# Patient Record
Sex: Female | Born: 1990 | Hispanic: Yes | Marital: Single | Smoking: Never smoker
Health system: Southern US, Community
[De-identification: ages and names within clinical notes are randomized; demographics above are authoritative.]

## PROBLEM LIST (undated history)

## (undated) DIAGNOSIS — Z789 Other specified health status: Secondary | ICD-10-CM

---

## 2016-08-03 NOTE — L&D Delivery Note (Signed)
Patient is a 26 y.o. now G1P1 s/p NSVD at 535w1d, who was admitted for PPROM at 35 weeks on 12/16.  She progressed with augmentation to complete and pushed 10minutes to deliver. NICU at bedside. Cord clamping delayed by one minute then clamped by CNM and cut by FOB.  Placenta intact and spontaneous, bleeding moderate. 1,08000mcg Cytotec given prophylactic (400 buccal and 600 rectal) .  1st degree laceration repaired without difficulty, left labial laceration identified not repaired- hemostatic.  Mom and baby stable prior to transfer to postpartum. She plans on breastfeeding. She is unsure on birth control.  Delivery Note At 7:12 PM a viable female was delivered via Vaginal, Spontaneous (Presentation: ROA ).  APGAR: 9, 9; weight 5 lb 4 oz (2380 g).   Placenta delivered intact via Veatrice KellsShultz and sent to pathology.   Anesthesia: IV Fentanyl  Episiotomy: None Lacerations: 1st degree;Perineal and left labial  Suture Repair: 3.0 vicryl Est. Blood Loss (mL): 250  Mom to postpartum.  Baby to Couplet care / Skin to Skin.  Sharyon CableVeronica C Sharmin Foulk CNM 07/19/2017, 8:13 PM

## 2017-01-08 ENCOUNTER — Emergency Department (HOSPITAL_COMMUNITY): Payer: Medicaid Other

## 2017-01-08 ENCOUNTER — Emergency Department (HOSPITAL_COMMUNITY)
Admission: EM | Admit: 2017-01-08 | Discharge: 2017-01-08 | Disposition: A | Discharge status: Home / Self Care | Payer: Medicaid Other | Attending: Emergency Medicine | Admitting: Emergency Medicine

## 2017-01-08 ENCOUNTER — Encounter (HOSPITAL_COMMUNITY): Payer: Self-pay | Admitting: Neurology

## 2017-01-08 DIAGNOSIS — O0001 Abdominal pregnancy with intrauterine pregnancy: Secondary | ICD-10-CM | POA: Insufficient documentation

## 2017-01-08 DIAGNOSIS — Z3A01 Less than 8 weeks gestation of pregnancy: Secondary | ICD-10-CM | POA: Diagnosis not present

## 2017-01-08 DIAGNOSIS — Z3491 Encounter for supervision of normal pregnancy, unspecified, first trimester: Secondary | ICD-10-CM

## 2017-01-08 LAB — CBC WITH DIFFERENTIAL/PLATELET
BASOS ABS: 0.1 10*3/uL (ref 0.0–0.1)
BASOS PCT: 1 %
Eosinophils Absolute: 0.1 10*3/uL (ref 0.0–0.7)
Eosinophils Relative: 1 %
HEMATOCRIT: 35.3 % — AB (ref 36.0–46.0)
Hemoglobin: 12.3 g/dL (ref 12.0–15.0)
LYMPHS PCT: 25 %
Lymphs Abs: 2.4 10*3/uL (ref 0.7–4.0)
MCH: 30.8 pg (ref 26.0–34.0)
MCHC: 34.8 g/dL (ref 30.0–36.0)
MCV: 88.3 fL (ref 78.0–100.0)
MONO ABS: 0.7 10*3/uL (ref 0.1–1.0)
Monocytes Relative: 8 %
NEUTROS ABS: 6.2 10*3/uL (ref 1.7–7.7)
NEUTROS PCT: 65 %
Platelets: 222 10*3/uL (ref 150–400)
RBC: 4 MIL/uL (ref 3.87–5.11)
RDW: 12.9 % (ref 11.5–15.5)
WBC: 9.4 10*3/uL (ref 4.0–10.5)

## 2017-01-08 LAB — BASIC METABOLIC PANEL
ANION GAP: 7 (ref 5–15)
BUN: 6 mg/dL (ref 6–20)
CALCIUM: 8.7 mg/dL — AB (ref 8.9–10.3)
CO2: 21 mmol/L — AB (ref 22–32)
Chloride: 106 mmol/L (ref 101–111)
Creatinine, Ser: 0.55 mg/dL (ref 0.44–1.00)
Glucose, Bld: 83 mg/dL (ref 65–99)
POTASSIUM: 4 mmol/L (ref 3.5–5.1)
Sodium: 134 mmol/L — ABNORMAL LOW (ref 135–145)

## 2017-01-08 LAB — HCG, QUANTITATIVE, PREGNANCY: hCG, Beta Chain, Quant, S: 197668 m[IU]/mL — ABNORMAL HIGH (ref <5)

## 2017-01-08 LAB — ABO/RH: ABO/RH(D): A POS

## 2017-01-08 NOTE — ED Notes (Signed)
Patient transported to Ultrasound 

## 2017-01-08 NOTE — ED Triage Notes (Signed)
Pt reports is about [redacted] weeks pregnant, went to get US yesterday and the technician indicating an abnormality, but wasn't able to specify what exactly. [facility] and her doctor can't see her for several weeks. Denies any problems, pain or concerns.

## 2017-01-08 NOTE — ED Provider Notes (Signed)
[facility] Provider Note   CSN: [id number] Arrival date & time: 01/08/17  1043     History   Chief Complaint Chief Complaint  Patient presents with  . Possible Pregnancy    HPI Sabrina Werner is a 26 y.o. female.  HPI   [facility] who saw an abnormality on her prenatal ultrasound.  Pt is G1P0 currently pregnant with LMP April 4.  She has no medical problems and has had no problems with this pregnancy so far.  Denies abdominal pain, abnormal vaginal discharge, vaginal bleeding, urinary symptoms, N/V, fevers.   She has recently been approved for pregnancy Medicaid but has not yet chosen a doctor.    History reviewed. No pertinent past medical history.  There are no active problems to display for this patient.   History reviewed. No pertinent surgical history.  OB History    Gravida Para Term Preterm AB Living   1             SAB TAB Ectopic Multiple Live Births                   Home Medications    Prior to Admission medications   Not on File    Family History No family history on file.  Social History Social History  Substance Use Topics  . Smoking status: Never Smoker  . Smokeless tobacco: Never Used  . Alcohol use No     Allergies   Patient has no known allergies.   Review of Systems Review of Systems  All other systems reviewed and are negative.    Physical Exam Updated Vital Signs BP 96/65 (BP Location: Left Arm)   Pulse (!) 58   Temp 99.1 F (37.3 C) (Oral)   Resp 12   Ht 5\' 4"  (1.626 m)   Wt 65.8 kg (145 lb)   LMP 11/04/2016   SpO2 100%   BMI 24.89 kg/m   Physical Exam  Constitutional: She appears well-developed and well-nourished. No distress.  HENT:  Head: Normocephalic and atraumatic.  Neck: Neck supple.  Cardiovascular: Normal rate and regular rhythm.   Pulmonary/Chest: Effort normal and breath sounds normal. No respiratory distress. She has no wheezes.  She has no rales.  Abdominal: Soft. She exhibits no distension. There is no tenderness. There is no rebound and no guarding.  Neurological: She is alert.  Skin: She is not diaphoretic.  Nursing note and vitals reviewed.    ED Treatments / Results  Labs (all labs ordered are listed, but only abnormal results are displayed) Labs Reviewed  HCG, QUANTITATIVE, PREGNANCY - Abnormal; Notable for the following:       Result Value   hCG, Beta Chain, [place] LongestQuant, S [id number] (*)    All other components within normal limits  CBC WITH DIFFERENTIAL/PLATELET - Abnormal; Notable for the following:    HCT 35.3 (*)    All other components within normal limits  BASIC METABOLIC PANEL - Abnormal; Notable for the following:    Sodium 134 (*)    CO2 21 (*)    Calcium 8.7 (*)    All other components within normal limits  ABO/RH    EKG  EKG Interpretation None       Radiology [place] Ob Comp < 14 Wks  Result Date: 01/08/2017 CLINICAL DATA:  26 year old pregnant female with reported history of abnormal ultrasound of the outside location. Estimated gestational age of [redacted] weeks 2 days by LMP.  Beta HCG of 197,000 EXAM: OBSTETRIC <14 WK [place] AND TRANSVAGINAL OB US TECHNIQUE: Both transabdominal and transvaginal ultrasound examinations were performed for complete evaluation of the gestation as well as the maternal uterus, adnexal regions, and pelvic cul-de-sac. Transvaginal technique was performed to assess early pregnancy. COMPARISON:  None. FINDINGS: Intrauterine gestational sac: Single Yolk sac:  Visualized. Embryo:  Visualized. Cardiac Activity: Visualized. Heart Rate: 149  bpm CRL:  14.5  mm   7 w   5 d                  [place] EDC: 08/22/2017 Subchorionic hemorrhage:  None visualized. Maternal uterus/adnexae: The ovaries and adnexal regions are unremarkable. IMPRESSION: Single living intrauterine gestation with estimated gestational age of [redacted] weeks 5 days by this ultrasound. No evidence of subchorionic hemorrhage.  Electronically Signed   By: Harmon Pier M.D.   On: 01/08/2017 15:28   US Ob Transvaginal  Result Date: 01/08/2017 CLINICAL DATA:  26 year old pregnant female with reported history of abnormal ultrasound of the outside location. Estimated gestational age of [redacted] weeks 2 days by LMP. Beta HCG of 197,000 EXAM: OBSTETRIC <14 WK [place] AND TRANSVAGINAL OB US TECHNIQUE: Both transabdominal and transvaginal ultrasound examinations were performed for complete evaluation of the gestation as well as the maternal uterus, adnexal regions, and pelvic cul-de-sac. Transvaginal technique was performed to assess early pregnancy. COMPARISON:  None. FINDINGS: Intrauterine gestational sac: Single Yolk sac:  Visualized. Embryo:  Visualized. Cardiac Activity: Visualized. Heart Rate: 149  bpm CRL:  14.5  mm   7 w   5 d                  [place] EDC: 08/22/2017 Subchorionic hemorrhage:  None visualized. Maternal uterus/adnexae: The ovaries and adnexal regions are unremarkable. IMPRESSION: Single living intrauterine gestation with estimated gestational age of [redacted] weeks 5 days by this ultrasound. No evidence of subchorionic hemorrhage. Electronically Signed   By: Harmon Pier M.D.   On: 01/08/2017 15:28    Procedures Procedures (including critical care time)  Medications Ordered in ED Medications - No data to display   Initial Impression / Assessment and Plan / ED Course  I have reviewed the triage vital signs and the nursing notes.  Pertinent labs & imaging results that were available during my care of the patient were reviewed by me and considered in my medical decision making (see chart for details).     Afebrile, nontoxic patient with asymptomatic pregnancy, concern for ectopic as pt was sent in from pregnancy center with the message that there was something abnormal on her [place].  US demonstrates normal IUP, [redacted]w[redacted]d without concerning findings.   Pt plans to buy prenatal vitamins today, declines prescription.  D/C home with OBGYN follow  up.   Discussed result, findings, treatment, and follow up  with patient.  Pt given return precautions.  Pt verbalizes understanding and agrees with plan.       Final Clinical Impressions(s) / ED Diagnoses   Final diagnoses:  Normal IUP (intrauterine pregnancy) on prenatal ultrasound, first trimester    New Prescriptions There are no discharge medications for this patient.    Trixie Dredge, PA-C 01/08/17 1717    Tilden Fossa, MD 01/09/17 [phone]

## 2017-01-08 NOTE — Discharge Instructions (Signed)
Read the information below.  You may return to the Emergency Department at any time for worsening condition or any new symptoms that concern you. °

## 2017-03-23 ENCOUNTER — Encounter: Payer: Self-pay | Admitting: Certified Nurse Midwife

## 2017-03-23 ENCOUNTER — Other Ambulatory Visit (HOSPITAL_COMMUNITY)
Admission: RE | Admit: 2017-03-23 | Discharge: 2017-03-23 | Disposition: A | Discharge status: Home / Self Care | Payer: Medicaid Other | Source: Ambulatory Visit | Attending: Certified Nurse Midwife | Admitting: Certified Nurse Midwife

## 2017-03-23 ENCOUNTER — Ambulatory Visit (INDEPENDENT_AMBULATORY_CARE_PROVIDER_SITE_OTHER): Payer: Medicaid Other | Admitting: Certified Nurse Midwife

## 2017-03-23 VITALS — BP 124/75 | HR 90 | Wt 151.7 lb

## 2017-03-23 DIAGNOSIS — O0932 Supervision of pregnancy with insufficient antenatal care, second trimester: Secondary | ICD-10-CM | POA: Insufficient documentation

## 2017-03-23 DIAGNOSIS — Z3490 Encounter for supervision of normal pregnancy, unspecified, unspecified trimester: Secondary | ICD-10-CM

## 2017-03-23 DIAGNOSIS — Z3A00 Weeks of gestation of pregnancy not specified: Secondary | ICD-10-CM | POA: Insufficient documentation

## 2017-03-23 DIAGNOSIS — O98819 Other maternal infectious and parasitic diseases complicating pregnancy, unspecified trimester: Secondary | ICD-10-CM | POA: Diagnosis not present

## 2017-03-23 DIAGNOSIS — Z3402 Encounter for supervision of normal first pregnancy, second trimester: Secondary | ICD-10-CM

## 2017-03-23 DIAGNOSIS — N760 Acute vaginitis: Secondary | ICD-10-CM | POA: Diagnosis not present

## 2017-03-23 DIAGNOSIS — B9689 Other specified bacterial agents as the cause of diseases classified elsewhere: Secondary | ICD-10-CM | POA: Diagnosis not present

## 2017-03-23 DIAGNOSIS — Z113 Encounter for screening for infections with a predominantly sexual mode of transmission: Secondary | ICD-10-CM

## 2017-03-23 MED ORDER — PRENATE PIXIE 10-0.6-0.4-200 MG PO CAPS: 1.0000 | ORAL_CAPSULE | Freq: Every day | ORAL | 12 refills | Status: AC

## 2017-03-23 NOTE — Progress Notes (Signed)
Subjective:    Sabrina Werner is being seen today for her first obstetrical visit.  This is a planned pregnancy. She is at [redacted]w[redacted]d gestation. Her obstetrical history is significant for none. Relationship with FOB: significant other, living together. Patient does intend to breast feed. Pregnancy history fully reviewed.  The information documented in the HPI was reviewed and verified.  Menstrual History: OB History    Gravida Para Term Preterm AB Living   1             SAB TAB Ectopic Multiple Live Births                   Patient's last menstrual period was 11/04/2016.    History reviewed. No pertinent past medical history.  History reviewed. No pertinent surgical history.   (Not in a hospital admission) No Known Allergies  Social History  Substance Use Topics  . Smoking status: Never Smoker  . Smokeless tobacco: Never Used  . Alcohol use No    Family History  Problem Relation Age of Onset  . Diabetes Mother   . Diabetes Father      Review of Systems Constitutional: negative for weight loss Gastrointestinal: negative for vomiting Genitourinary:negative for genital lesions and vaginal discharge and dysuria Musculoskeletal:negative for back pain Behavioral/Psych: negative for abusive relationship, depression, illegal drug usage and tobacco use    Objective:    BP 124/75   Pulse 90   Wt 151 lb 11.2 oz (68.8 kg)   LMP 11/04/2016   BMI 26.04 kg/m  General Appearance:    Alert, cooperative, no distress, appears stated age  Head:    Normocephalic, without obvious abnormality, atraumatic  Eyes:    PERRL, conjunctiva/corneas clear, EOM's intact, fundi    benign, both eyes  Ears:    Normal TM's and external ear canals, both ears  Nose:   Nares normal, septum midline, mucosa normal, no drainage    or sinus tenderness  Throat:   Lips, mucosa, and tongue normal; teeth and gums normal  Neck:   Supple, symmetrical, trachea midline, no adenopathy;    thyroid:  no  enlargement/tenderness/nodules; no carotid   bruit or JVD  Back:     Symmetric, no curvature, ROM normal, no CVA tenderness  Lungs:     Clear to auscultation bilaterally, respirations unlabored  Chest Wall:    No tenderness or deformity   Heart:    Regular rate and rhythm, S1 and S2 normal, no murmur, rub   or gallop  Breast Exam:    No tenderness, masses, or nipple abnormality  Abdomen:     Soft, non-tender, bowel sounds active all four quadrants,    no masses, no organomegaly  Genitalia:    Normal female without lesion, discharge or tenderness  Extremities:   Extremities normal, atraumatic, no cyanosis or edema  Pulses:   2+ and symmetric all extremities  Skin:   Skin color, texture, turgor normal, no rashes or lesions  Lymph nodes:   Cervical, supraclavicular, and axillary nodes normal  Neurologic:   CNII-XII intact, normal strength, sensation and reflexes    throughout         Cervix:  Long, thick, closed and posterior.  FHR: 155-160's by doppler.  FH: 17 cm  Lab Review Urine pregnancy test Labs reviewed yes Radiologic studies reviewed no  Assessment & Plan    Pregnancy at [redacted]w[redacted]d weeks    1. Encounter for supervision of normal pregnancy, antepartum, unspecified gravidity  - Cytology - PAP -  Cervicovaginal ancillary only - Culture, OB Urine - Cystic Fibrosis Mutation 97 - Obstetric Panel, Including HIV - VITAMIN D 25 Hydroxy (Vit-D Deficiency, Fractures) - Varicella zoster antibody, IgG - MaterniT21 PLUS Core+SCA - Hemoglobinopathy evaluation - Hemoglobin A1c - AFP, Serum, Open Spina Bifida - [place] MFM OB COMP + 14 WK; Future - Prenat-FeAsp-Meth-FA-DHA w/o A (PRENATE PIXIE) 10-0.6-0.4-200 MG CAPS; Take 1 tablet by mouth daily.  Dispense: 30 capsule; Refill: 12   2. Late to care @19  weeks  Prenatal vitamins.  Counseling provided regarding continued use of seat belts, cessation of alcohol consumption, smoking or use of illicit drugs; infection precautions i.e.,  influenza/TDAP immunizations, toxoplasmosis,CMV, parvovirus, listeria and varicella; workplace safety, exercise during pregnancy; routine dental care, safe medications, sexual activity, hot tubs, saunas, pools, travel, caffeine use, fish and methlymercury, potential toxins, hair treatments, varicose veins Weight gain recommendations per IOM guidelines reviewed: underweight/BMI< 18.5--> gain 28 - 40 lbs; normal weight/BMI 18.5 - 24.9--> gain 25 - 35 lbs; overweight/BMI 25 - 29.9--> gain 15 - 25 lbs; obese/BMI >30->gain  11 - 20 lbs Problem list reviewed and updated. FIRST/CF mutation testing/NIPT/QUAD SCREEN/fragile X/Ashkenazi Jewish population testing/Spinal muscular atrophy discussed: ordered. Role of ultrasound in pregnancy discussed; fetal survey: ordered. Amniocentesis discussed: not indicated.  Meds ordered this encounter  Medications  . Prenat-FeAsp-Meth-FA-DHA w/o A (PRENATE PIXIE) 10-0.6-0.4-200 MG CAPS    Sig: Take 1 tablet by mouth daily.    Dispense:  30 capsule    Refill:  12    Please process coupon: Rx BIN: [id number], RxPCN: [id number], RxGRP: [id number], RxID: [id number]  SUF: 01   Orders Placed This Encounter  Procedures  . Culture, OB Urine  . [place] MFM OB COMP + 14 WK    Standing Status:   Future    Standing Expiration Date:   05/23/2018    Order Specific Question:   Reason for Exam (SYMPTOM  OR DIAGNOSIS REQUIRED)    Answer:   late to care, needs full anatomy scan    Order Specific Question:   Preferred imaging location?    Answer:   [facility]  . Cystic Fibrosis Mutation 97  . Obstetric Panel, Including HIV  . VITAMIN D 25 Hydroxy (Vit-D Deficiency, Fractures)  . Varicella zoster antibody, IgG  . MaterniT21 PLUS Core+SCA    Order Specific Question:   Is the patient insulin dependent?    Answer:   No    Order Specific Question:   Please enter gestational age. This should be expressed as weeks AND days, i.e. 16w 6d. Enter weeks here. Enter days in next question.     Answer:   34    Order Specific Question:   Please enter gestational age. This should be expressed as weeks AND days, i.e. 16w 6d. Enter days here. Enter weeks in previous question.    Answer:   6    Order Specific Question:   How was gestational age calculated?    Answer:   LMP    Order Specific Question:   Please give the date of LMP OR Ultrasound OR Estimated date of delivery.    Answer:   08/11/2017    Order Specific Question:   Number of Fetuses (Type of Pregnancy):    Answer:   1    Order Specific Question:   Indications for performing the test? (please choose all that apply):    Answer:   Routine screening    Order Specific Question:   Other Indications? (Y=Yes, N=No)    Answer:  N    Order Specific Question:   If this is a repeat specimen, please indicate the reason:    Answer:   Not indicated    Order Specific Question:   Please specify the patient's race: (C=White/Caucasion, B=Black, I=Native American, A=Asian, H=Hispanic, O=Other, U=Unknown)    Answer:   B    Order Specific Question:   Donor Egg - indicate if the egg was obtained from in vitro fertilization.    Answer:   N    Order Specific Question:   Age of Egg Donor.    Answer:   71    Order Specific Question:   Prior Down Syndrome/ONTD screening during current pregnancy.    Answer:   N    Order Specific Question:   Prior First Trimester Testing    Answer:   N    Order Specific Question:   Prior Second Trimester Testing    Answer:   N    Order Specific Question:   Family History of Neural Tube Defects    Answer:   N    Order Specific Question:   Prior Pregnancy with Down Syndrome    Answer:   N    Order Specific Question:   Please give the patient's weight (in pounds)    Answer:   152  . Hemoglobinopathy evaluation  . Hemoglobin A1c  . AFP, Serum, Open Spina Bifida    Order Specific Question:   Is patient insulin dependent?    Answer:   No    Order Specific Question:   Weight (lbs)    Answer:   28    Order  Specific Question:   Gestational Age (GA), weeks    Answer:   19.6    Order Specific Question:   Date on which patient was at this GA    Answer:   03/23/2017    Order Specific Question:   GA Calculation Method    Answer:   LMP    Order Specific Question:   GA Date    Answer:   08/11/2017    Order Specific Question:   Number of fetuses    Answer:   1    Order Specific Question:   Donor egg?    Answer:   N    Order Specific Question:   Age of egg donor?    Answer:   26    Follow up in 4 weeks. 50% of 30 min visit spent on counseling and coordination of care.

## 2017-03-24 ENCOUNTER — Other Ambulatory Visit: Payer: Self-pay | Admitting: Certified Nurse Midwife

## 2017-03-24 DIAGNOSIS — R7989 Other specified abnormal findings of blood chemistry: Secondary | ICD-10-CM | POA: Insufficient documentation

## 2017-03-24 DIAGNOSIS — N760 Acute vaginitis: Principal | ICD-10-CM

## 2017-03-24 DIAGNOSIS — B9689 Other specified bacterial agents as the cause of diseases classified elsewhere: Secondary | ICD-10-CM

## 2017-03-24 LAB — CERVICOVAGINAL ANCILLARY ONLY
Bacterial vaginitis: POSITIVE — AB
CANDIDA VAGINITIS: NEGATIVE
Chlamydia: NEGATIVE
NEISSERIA GONORRHEA: NEGATIVE
TRICH (WINDOWPATH): NEGATIVE

## 2017-03-24 LAB — VITAMIN D 25 HYDROXY (VIT D DEFICIENCY, FRACTURES): VIT D 25 HYDROXY: 13 ng/mL — AB (ref 30.0–100.0)

## 2017-03-24 MED ORDER — METRONIDAZOLE 500 MG PO TABS: 500.0000 mg | ORAL_TABLET | Freq: Two times a day (BID) | ORAL | 0 refills | Status: DC

## 2017-03-24 MED ORDER — VITAMIN D (ERGOCALCIFEROL) 1.25 MG (50000 UNIT) PO CAPS: 50000.0000 [IU] | ORAL_CAPSULE | ORAL | 2 refills | Status: AC

## 2017-03-25 ENCOUNTER — Other Ambulatory Visit: Payer: Self-pay | Admitting: Certified Nurse Midwife

## 2017-03-25 ENCOUNTER — Telehealth: Payer: Self-pay

## 2017-03-25 DIAGNOSIS — Z3400 Encounter for supervision of normal first pregnancy, unspecified trimester: Secondary | ICD-10-CM

## 2017-03-25 LAB — CYTOLOGY - PAP: Diagnosis: NEGATIVE

## 2017-03-25 NOTE — Telephone Encounter (Signed)
Patient notified of results and Rx. 

## 2017-03-25 NOTE — Telephone Encounter (Signed)
-----   Message from Roe Coombs, CNM sent at 03/24/2017  4:47 PM EDT ----- Please let her know that she has BV, & yeast.  An Rx for Flagyl has been sent to the pharmacy for the BV.  Please let her know that her vitamin D level is low.  Vitamin D weekly tablet has been sent to her pharmacy for her to take.  Thank you.  R.Denney CNM

## 2017-03-26 LAB — OBSTETRIC PANEL, INCLUDING HIV
Antibody Screen: NEGATIVE
BASOS ABS: 0.1 10*3/uL (ref 0.0–0.2)
Basos: 0 %
EOS (ABSOLUTE): 0.2 10*3/uL (ref 0.0–0.4)
EOS: 1 %
HEMOGLOBIN: 13.1 g/dL (ref 11.1–15.9)
HEP B S AG: NEGATIVE
HIV SCREEN 4TH GENERATION: NONREACTIVE
Hematocrit: 38.4 % (ref 34.0–46.6)
IMMATURE GRANULOCYTES: 1 %
Immature Grans (Abs): 0.1 10*3/uL (ref 0.0–0.1)
LYMPHS ABS: 2.3 10*3/uL (ref 0.7–3.1)
Lymphs: 19 %
MCH: 32 pg (ref 26.6–33.0)
MCHC: 34.1 g/dL (ref 31.5–35.7)
MCV: 94 fL (ref 79–97)
MONOCYTES: 7 %
Monocytes Absolute: 0.9 10*3/uL (ref 0.1–0.9)
NEUTROS ABS: 8.8 10*3/uL — AB (ref 1.4–7.0)
Neutrophils: 72 %
PLATELETS: 299 10*3/uL (ref 150–379)
RBC: 4.09 x10E6/uL (ref 3.77–5.28)
RDW: 14 % (ref 12.3–15.4)
RH TYPE: POSITIVE
RPR: NONREACTIVE
RUBELLA: 1.22 {index} (ref >0.99)
WBC: 12.3 10*3/uL — AB (ref 3.4–10.8)

## 2017-03-26 LAB — HEMOGLOBIN A1C
Est. average glucose Bld gHb Est-mCnc: 80 mg/dL
Hgb A1c MFr Bld: 4.4 % — ABNORMAL LOW (ref 4.8–5.6)

## 2017-03-26 LAB — HEMOGLOBINOPATHY EVALUATION
HEMOGLOBIN A2 QUANTITATION: 2.4 % (ref 1.8–3.2)
HEMOGLOBIN F QUANTITATION: 0.6 % (ref 0.0–2.0)
HGB A: 97 % (ref 96.4–98.8)
HGB C: 0 %
HGB S: 0 %
HGB VARIANT: 0 %

## 2017-03-26 LAB — AFP, SERUM, OPEN SPINA BIFIDA
AFP MOM: 1.1
AFP Value: 60 ng/mL
GEST. AGE ON COLLECTION DATE: 19.9 wk
Maternal Age At EDD: 28.7 yr
OSBR RISK 1 IN: 8933
TEST RESULTS AFP: NEGATIVE
Weight: 152 [lb_av]

## 2017-03-26 LAB — VARICELLA ZOSTER ANTIBODY, IGG: Varicella zoster IgG: 955 index (ref >165)

## 2017-03-27 LAB — URINE CULTURE, OB REFLEX

## 2017-03-27 LAB — CULTURE, OB URINE

## 2017-03-28 LAB — MATERNIT21 PLUS CORE+SCA
CHROMOSOME 13: NEGATIVE
CHROMOSOME 21: NEGATIVE
Chromosome 18: NEGATIVE
Y CHROMOSOME: DETECTED

## 2017-03-30 ENCOUNTER — Other Ambulatory Visit: Payer: Self-pay | Admitting: Certified Nurse Midwife

## 2017-03-30 ENCOUNTER — Telehealth: Payer: Self-pay

## 2017-03-30 DIAGNOSIS — Z3400 Encounter for supervision of normal first pregnancy, unspecified trimester: Secondary | ICD-10-CM

## 2017-03-30 DIAGNOSIS — O2342 Unspecified infection of urinary tract in pregnancy, second trimester: Secondary | ICD-10-CM

## 2017-03-30 MED ORDER — NITROFURANTOIN MONOHYD MACRO 100 MG PO CAPS: 100.0000 mg | ORAL_CAPSULE | Freq: Two times a day (BID) | ORAL | 0 refills | Status: DC

## 2017-03-30 NOTE — Telephone Encounter (Signed)
S/w pt and advised of results and rx sent. 

## 2017-03-31 ENCOUNTER — Other Ambulatory Visit: Payer: Self-pay | Admitting: Certified Nurse Midwife

## 2017-03-31 ENCOUNTER — Ambulatory Visit (HOSPITAL_COMMUNITY)
Admission: RE | Admit: 2017-03-31 | Discharge: 2017-03-31 | Disposition: A | Discharge status: Home / Self Care | Payer: Medicaid Other | Source: Ambulatory Visit | Attending: Certified Nurse Midwife | Admitting: Certified Nurse Midwife

## 2017-03-31 DIAGNOSIS — Z3A19 19 weeks gestation of pregnancy: Secondary | ICD-10-CM | POA: Insufficient documentation

## 2017-03-31 DIAGNOSIS — Z3A21 21 weeks gestation of pregnancy: Secondary | ICD-10-CM

## 2017-03-31 DIAGNOSIS — Z3490 Encounter for supervision of normal pregnancy, unspecified, unspecified trimester: Secondary | ICD-10-CM

## 2017-03-31 DIAGNOSIS — Z3400 Encounter for supervision of normal first pregnancy, unspecified trimester: Secondary | ICD-10-CM

## 2017-03-31 LAB — CYSTIC FIBROSIS MUTATION 97: GENE DIS ANAL CARRIER INTERP BLD/T-IMP: NOT DETECTED

## 2017-04-01 ENCOUNTER — Other Ambulatory Visit: Payer: Self-pay | Admitting: Certified Nurse Midwife

## 2017-04-01 DIAGNOSIS — Z3400 Encounter for supervision of normal first pregnancy, unspecified trimester: Secondary | ICD-10-CM

## 2017-04-20 ENCOUNTER — Encounter: Payer: Self-pay | Admitting: Certified Nurse Midwife

## 2017-04-20 ENCOUNTER — Ambulatory Visit (INDEPENDENT_AMBULATORY_CARE_PROVIDER_SITE_OTHER): Payer: Medicaid Other | Admitting: Certified Nurse Midwife

## 2017-04-20 VITALS — BP 103/73 | HR 79 | Wt 159.0 lb

## 2017-04-20 DIAGNOSIS — Z3490 Encounter for supervision of normal pregnancy, unspecified, unspecified trimester: Secondary | ICD-10-CM

## 2017-04-20 DIAGNOSIS — E559 Vitamin D deficiency, unspecified: Secondary | ICD-10-CM

## 2017-04-20 DIAGNOSIS — O0932 Supervision of pregnancy with insufficient antenatal care, second trimester: Secondary | ICD-10-CM

## 2017-04-20 DIAGNOSIS — R7989 Other specified abnormal findings of blood chemistry: Secondary | ICD-10-CM

## 2017-04-20 NOTE — Progress Notes (Signed)
   PRENATAL VISIT NOTE  Subjective:  Sabrina Werner is a 26 y.o. G1P0 at [redacted]w[redacted]d being seen today for ongoing prenatal care.  She is currently monitored for the following issues for this low-risk pregnancy and has Encounter for supervision of normal pregnancy, unspecified, unspecified trimester; Late prenatal care affecting pregnancy in second trimester; Low vitamin D level; and UTI (urinary tract infection) in pregnancy in second trimester on her problem list.  Patient reports no complaints.  Contractions: Not present. Vag. Bleeding: None.  Movement: Present. Denies leaking of fluid.   The following portions of the patient's history were reviewed and updated as appropriate: allergies, current medications, past family history, past medical history, past social history, past surgical history and problem list. Problem list updated.  Objective:   Vitals:   04/20/17 1338  BP: 103/73  Pulse: 79  Weight: 159 lb (72.1 kg)    Fetal Status: Fetal Heart Rate (bpm): 155; doppler Fundal Height: 23 cm Movement: Present     General:  Alert, oriented and cooperative. Patient is in no acute distress.  Skin: Skin is warm and dry. No rash noted.   Cardiovascular: Normal heart rate noted  Respiratory: Normal respiratory effort, no problems with respiration noted  Abdomen: Soft, gravid, appropriate for gestational age.  Pain/Pressure: Absent     Pelvic: Cervical exam deferred        Extremities: Normal range of motion.     Mental Status:  Normal mood and affect. Normal behavior. Normal judgment and thought content.   Assessment and Plan:  Pregnancy: G1P0 at [redacted]w[redacted]d  1. Encounter for supervision of normal pregnancy, antepartum, unspecified gravidity     Doing well  2. Late prenatal care affecting pregnancy in second trimester      weeks  3. Low vitamin D level     Taking weekly vitamin D  Preterm labor symptoms and general obstetric precautions including but not limited to vaginal bleeding,  contractions, leaking of fluid and fetal movement were reviewed in detail with the patient. Please refer to After Visit Summary for other counseling recommendations.  Return in about 4 weeks (around 05/18/2017) for ROB, 2 hr OGTT.   Roe Coombs, CNM

## 2017-04-20 NOTE — Progress Notes (Signed)
Patient declined the FLU vaccine. 

## 2017-04-20 NOTE — Patient Instructions (Addendum)
AREA PEDIATRIC/FAMILY PRACTICE PHYSICIANS  [facility] [address], [address] [place], [place]  [address] Phone - [phone]   Fax - [phone]  [facility] [address] [place], [place] [address] Phone - [phone]   Fax - [phone]  [facility] [address] [place], [place]  [address] Phone - [phone]   Fax - [phone]  [facility] [address] [place], [place]  [address] Phone - [phone]   Fax - [phone]  [facility] [address] [place], [place]  [address] Phone - [phone]   Fax - [phone]  [facility] [address] [place], [place]  [address] Phone - [phone]   Fax - [phone]  [facility] [address] [place], [place]  [address] Phone - [phone]   Fax - [phone]  [facility] [address], [address] [place], [place]  [address] Phone - [phone]   Fax - [phone]  [facility] [address] [place], [place]  [address] Phone - [phone]   Fax - [phone] [facility] [address] [place], [place]  [address] Phone - [phone]   Fax - [phone]  [facility] [address] [place], [place]  [address] Phone - [phone]   Fax - [phone]  [facility] [address] [place], [place]  [address] Phone - [phone]   Fax - [phone]  [facility] [address] [place], [place]  [address] Phone - [phone]   Fax - [phone]  [facility] [address] [place], [place]  [address] Phone - [phone]  [facility] [address] [place], [place]  [address] Phone - [phone]   Fax - [phone]  [facility] [address] [place], [place]  [address] Phone - [phone]   Fax - [phone]  [facility] [address] [place], [place]  [address] Phone - [phone]   Fax - [phone]  [facility] [address] [place], [place]  [address] Phone - [phone]   Fax - [phone]   [facility] [address] [place], [place]  [address] Phone - [phone]   Fax - [phone]  [facility] [address] [place], [place]  [address] Phone - [phone]   Fax - [phone]  [facility] [address] [place], [place]  [address] Phone - [phone]   Fax - [phone]  [facility] [address] [place], [place]  [address] Phone - [phone]   Fax - [phone]  [facility] [address] , [place]  [address] Phone - [phone]   Fax - [phone] [facility] [address] [place], [place]  [address] Phone - [phone]   Fax - [phone]  [facility] [address] [place], [place]  [address] Phone - [phone]   Fax - [phone]  [facility] [address] [place], [place]  [address] Phone - [phone]   Fax - [phone]  [facility] Charlene Flemming MD [address] [place]  [address] Phone [phone]  Fax [phone]  Places to have your son circumcised:    [facility] [phone] $480 by 4   wks  [facility] [phone] $244 by 4 wks  [facility] [phone] $[phone] by 2 wks  [facility] [phone] $[phone] by 14 days [facility] [phone] $[phone] by 4 wks  These prices sometimes change but are roughly what you can expect to pay. Please call and  confirm pricing.   Circumcision is considered an elective/non-medically necessary procedure. There are many reasons parents decide to have their sons circumsized. During the first year of life circumcised males have a reduced risk of urinary tract infections but after this year the rates between circumcised males and uncircumcised males are the same.  It is safe to have your son circumcised outside of the hospital and the places above perform them regularly.

## 2017-05-18 ENCOUNTER — Encounter: Payer: Medicaid Other | Admitting: Certified Nurse Midwife

## 2017-05-18 ENCOUNTER — Other Ambulatory Visit: Payer: Medicaid Other

## 2017-05-19 ENCOUNTER — Ambulatory Visit (HOSPITAL_COMMUNITY)
Admission: RE | Admit: 2017-05-19 | Discharge: 2017-05-19 | Disposition: A | Discharge status: Home / Self Care | Payer: Medicaid Other | Source: Ambulatory Visit | Attending: Certified Nurse Midwife | Admitting: Certified Nurse Midwife

## 2017-05-19 DIAGNOSIS — Z3400 Encounter for supervision of normal first pregnancy, unspecified trimester: Secondary | ICD-10-CM

## 2017-05-19 DIAGNOSIS — Z362 Encounter for other antenatal screening follow-up: Secondary | ICD-10-CM | POA: Diagnosis present

## 2017-05-19 DIAGNOSIS — Z3A26 26 weeks gestation of pregnancy: Secondary | ICD-10-CM | POA: Diagnosis not present

## 2017-05-19 DIAGNOSIS — Z3402 Encounter for supervision of normal first pregnancy, second trimester: Secondary | ICD-10-CM | POA: Diagnosis present

## 2017-05-20 ENCOUNTER — Other Ambulatory Visit: Payer: Self-pay | Admitting: Certified Nurse Midwife

## 2017-05-20 DIAGNOSIS — Z3400 Encounter for supervision of normal first pregnancy, unspecified trimester: Secondary | ICD-10-CM

## 2017-05-24 ENCOUNTER — Ambulatory Visit (INDEPENDENT_AMBULATORY_CARE_PROVIDER_SITE_OTHER): Payer: Medicaid Other | Admitting: Certified Nurse Midwife

## 2017-05-24 ENCOUNTER — Other Ambulatory Visit: Payer: Medicaid Other

## 2017-05-24 VITALS — BP 98/62 | HR 78 | Wt 171.0 lb

## 2017-05-24 DIAGNOSIS — Z3400 Encounter for supervision of normal first pregnancy, unspecified trimester: Secondary | ICD-10-CM

## 2017-05-24 DIAGNOSIS — Z23 Encounter for immunization: Secondary | ICD-10-CM | POA: Diagnosis not present

## 2017-05-24 DIAGNOSIS — O0933 Supervision of pregnancy with insufficient antenatal care, third trimester: Secondary | ICD-10-CM

## 2017-05-24 DIAGNOSIS — Z3403 Encounter for supervision of normal first pregnancy, third trimester: Secondary | ICD-10-CM

## 2017-05-24 DIAGNOSIS — O0932 Supervision of pregnancy with insufficient antenatal care, second trimester: Secondary | ICD-10-CM

## 2017-05-24 NOTE — Addendum Note (Signed)
Addended by: Natale MilchSTALLING, Lamon Rotundo D on: 05/24/2017 10:07 AM   Modules accepted: Orders

## 2017-05-24 NOTE — Progress Notes (Signed)
   PRENATAL VISIT NOTE  Subjective:  Sabrina Werner is a 26 y.o. G1P0 at 6866w5d being seen today for ongoing prenatal care.  She is currently monitored for the following issues for this low-risk pregnancy and has Encounter for supervision of normal pregnancy, unspecified, unspecified trimester; Late prenatal care affecting pregnancy in second trimester; Low vitamin D level; and UTI (urinary tract infection) in pregnancy in second trimester on her problem list.  Patient reports no complaints.  Contractions: Not present. Vag. Bleeding: None.  Movement: Present. Denies leaking of fluid.   The following portions of the patient's history were reviewed and updated as appropriate: allergies, current medications, past family history, past medical history, past social history, past surgical history and problem list. Problem list updated.  Objective:   Vitals:   05/24/17 0854  BP: 98/62  Pulse: 78  Weight: 171 lb (77.6 kg)    Fetal Status: Fetal Heart Rate (bpm): 132; doppler Fundal Height: 28 cm Movement: Present     General:  Alert, oriented and cooperative. Patient is in no acute distress.  Skin: Skin is warm and dry. No rash noted.   Cardiovascular: Normal heart rate noted  Respiratory: Normal respiratory effort, no problems with respiration noted  Abdomen: Soft, gravid, appropriate for gestational age.  Pain/Pressure: Absent     Pelvic: Cervical exam deferred        Extremities: Normal range of motion.  Edema: None  Mental Status:  Normal mood and affect. Normal behavior. Normal judgment and thought content.   Assessment and Plan:  Pregnancy: G1P0 at 5466w5d  1. Supervision of normal first pregnancy, antepartum     Doing well.  TDaP today.  - Glucose Tolerance, 2 Hours w/1 Hour - HIV antibody (with reflex) - RPR - CBC  2. Late prenatal care affecting pregnancy in second trimester     @19  wks.   Preterm labor symptoms and general obstetric precautions including but not limited to  vaginal bleeding, contractions, leaking of fluid and fetal movement were reviewed in detail with the patient. Please refer to After Visit Summary for other counseling recommendations.  Return in about 2 weeks (around 06/07/2017) for ROB.   Roe Coombsachelle A Denney, CNM

## 2017-05-24 NOTE — Patient Instructions (Addendum)
AREA PEDIATRIC/FAMILY PRACTICE PHYSICIANS  [facility] [address], [address] [place], [place]  [address] Phone - [phone]   Fax - [phone]  [facility] [address] [place], [place] [address] Phone - [phone]   Fax - [phone]  JACK AM[facility] [address] [place], [place]  [address] Phone - [phone]   Fax - [phone]  [facility] [address] [place], [place]  [address] Phone - [phone]   Fax - [phone]  [facility] [address] [place], [place]  [address] Phone - [phone]   Fax - [phone]  [facility] [address], [address] [place], [place]  [address] Phone - [phone]   Fax - [phone]  [facility] [address] [place], [place]  [address] Phone - [phone]   Fax - [phone]  [facility] [address] [place], [place]  [address] Phone - [phone]   Fax - [phone]  [facility] [address] [place], [place]  [address] Phone - [phone]   Fax - [phone] [facility] [address] [place], [place]  [address] Phone - [phone]   Fax - [phone]  [facility] [address] [place], [place]  [address] Phone - [phone]   Fax - [phone]  [facility] [address] [place], [place]  [address] Phone - [phone]   Fax - [phone]  [facility] [address], [address] [place], [place]  [address] Phone - [phone]   Fax - [phone]  [facility]IL[facility] GOSRANI [address] [place], [place]  [address] Phone - [phone]  [facility] [address] [place], [place]  [address] Phone - [phone]   Fax - [phone]  [facility] [address] [place], [place]  [address] Phone - [phone]   Fax - [phone]  [facility] [address] [place], [place]  [address] Phone - [phone]   Fax - [phone]   [facility] [address] [place], [place]  [address] Phone - [phone]   Fax - [phone]   [facility] [address] [place], [place]  [address] Phone - [phone]   Fax - [phone]  [facility] [address] [place], [place]  [address] Phone - [phone]   Fax - [phone]  DAVID RUBIN [address] [place], [place]  [address] Phone - [phone]   Fax - [phone]  [facility] [address] [place], [place]  [address] Phone - [phone]   Fax - [phone]  [facility] [address] , [place]  [address] Phone - [phone]   Fax - [phone] [facility] [address] [place], [place]  [address] Phone - [phone]   Fax - [phone]  [facility] [address] [place], [place]  [address] Phone - [phone]   Fax - [phone]  [facility] [place] [facility] [address] , [place]  [address] Phone - [phone]   Fax - [phone]  [facility] [place] Charlene Flemming MD [address] [place]  [address] Phone [phone]  Fax [phone]   Third Trimester of Pregnancy The third trimester is from week 29 through week 42,   months 7 through 9. This trimester is when your unborn baby (fetus) is growing very fast. At the end of the ninth month, the unborn baby is about 20 inches in length. It weighs about 6-10 pounds. Follow these instructions at home:  Avoid all smoking, herbs, and alcohol. Avoid drugs not approved by your doctor.  Do not use any tobacco products, including cigarettes, chewing tobacco, and electronic  cigarettes. If you need help quitting, ask your doctor. You may get counseling or other support to help you quit.  Only take medicine as told by your doctor. Some medicines are safe and some are not during pregnancy.  Exercise only as told by your doctor. Stop exercising if you start having cramps.  Eat regular, healthy meals.  Wear a good support bra if your breasts are tender.  Do not use hot tubs, steam rooms, or saunas.  Wear your seat belt when driving.  Avoid raw meat, uncooked cheese, and liter boxes and soil used by cats.  Take your prenatal vitamins.  Take 1500-2000 milligrams of calcium daily starting at the 20th week of pregnancy until you deliver your baby.  Try taking medicine that helps you poop (stool softener) as needed, and if your doctor approves. Eat more fiber by eating fresh fruit, vegetables, and whole grains. Drink enough fluids to keep your pee (urine) clear or pale yellow.  Take warm water baths (sitz baths) to soothe pain or discomfort caused by hemorrhoids. Use hemorrhoid cream if your doctor approves.  If you have puffy, bulging veins (varicose veins), wear support hose. Raise (elevate) your feet for 15 minutes, 3-4 times a day. Limit salt in your diet.  Avoid heavy lifting, wear low heels, and sit up straight.  Rest with your legs raised if you have leg cramps or low back pain.  Visit your dentist if you have not gone during your pregnancy. Use a soft toothbrush to brush your teeth. Be gentle when you floss.  You can have sex (intercourse) unless your doctor tells you not to.  Do not travel far distances unless you must. Only do so with your doctor's approval.  Take prenatal classes.  Practice driving to the hospital.  Pack your hospital bag.  Prepare the baby's room.  Go to your doctor visits. Get help if:  You are not sure if you are in labor or if your water has broken.  You are dizzy.  You have mild cramps or pressure in your lower  belly (abdominal).  You have a nagging pain in your belly area.  You continue to feel sick to your stomach (nauseous), throw up (vomit), or have watery poop (diarrhea).  You have bad smelling fluid coming from your vagina.  You have pain with peeing (urination). Get help right away if:  You have a fever.  You are leaking fluid from your vagina.  You are spotting or bleeding from your vagina.  You have severe belly cramping or pain.  You lose or gain weight rapidly.  You have trouble catching your breath and have chest pain.  You notice sudden or extreme puffiness (swelling) of your face, hands, ankles, feet, or legs.  You have not felt the baby move in over an hour.  You have severe headaches that do not go away with medicine.  You have vision changes. This information is not intended to replace advice given to you by your health care provider. Make sure you discuss any questions you have with your health care provider. Document   Released: 10/14/2009 Document Revised: 12/26/2015 Document Reviewed: 09/20/2012 Elsevier Interactive Patient Education  2017 Elsevier Inc.  

## 2017-05-24 NOTE — Progress Notes (Signed)
Patient reports good fetal movement, denies pain. 

## 2017-05-25 LAB — CBC
HEMOGLOBIN: 12.6 g/dL (ref 11.1–15.9)
Hematocrit: 37.6 % (ref 34.0–46.6)
MCH: 32.1 pg (ref 26.6–33.0)
MCHC: 33.5 g/dL (ref 31.5–35.7)
MCV: 96 fL (ref 79–97)
PLATELETS: 248 10*3/uL (ref 150–379)
RBC: 3.93 x10E6/uL (ref 3.77–5.28)
RDW: 14.2 % (ref 12.3–15.4)
WBC: 10.1 10*3/uL (ref 3.4–10.8)

## 2017-05-25 LAB — GLUCOSE TOLERANCE, 2 HOURS W/ 1HR
GLUCOSE, 2 HOUR: 83 mg/dL (ref 65–152)
Glucose, 1 hour: 112 mg/dL (ref 65–179)
Glucose, Fasting: 73 mg/dL (ref 65–91)

## 2017-05-25 LAB — HIV ANTIBODY (ROUTINE TESTING W REFLEX): HIV Screen 4th Generation wRfx: NONREACTIVE

## 2017-05-25 LAB — RPR: RPR Ser Ql: NONREACTIVE

## 2017-05-27 ENCOUNTER — Other Ambulatory Visit: Payer: Self-pay | Admitting: Certified Nurse Midwife

## 2017-05-27 DIAGNOSIS — Z3400 Encounter for supervision of normal first pregnancy, unspecified trimester: Secondary | ICD-10-CM

## 2017-06-07 ENCOUNTER — Ambulatory Visit (INDEPENDENT_AMBULATORY_CARE_PROVIDER_SITE_OTHER): Payer: Medicaid Other | Admitting: Certified Nurse Midwife

## 2017-06-07 VITALS — BP 109/68 | HR 84 | Wt 177.2 lb

## 2017-06-07 DIAGNOSIS — O0933 Supervision of pregnancy with insufficient antenatal care, third trimester: Secondary | ICD-10-CM

## 2017-06-07 DIAGNOSIS — Z3400 Encounter for supervision of normal first pregnancy, unspecified trimester: Secondary | ICD-10-CM

## 2017-06-07 DIAGNOSIS — O0932 Supervision of pregnancy with insufficient antenatal care, second trimester: Secondary | ICD-10-CM

## 2017-06-07 DIAGNOSIS — O2343 Unspecified infection of urinary tract in pregnancy, third trimester: Secondary | ICD-10-CM

## 2017-06-07 DIAGNOSIS — R7989 Other specified abnormal findings of blood chemistry: Secondary | ICD-10-CM

## 2017-06-07 DIAGNOSIS — Z3403 Encounter for supervision of normal first pregnancy, third trimester: Secondary | ICD-10-CM

## 2017-06-07 NOTE — Progress Notes (Signed)
   PRENATAL VISIT NOTE  Subjective:  Sabrina Werner is a 26 y.o. G1P0 at 3281w5d being seen today for ongoing prenatal care.  She is currently monitored for the following issues for this low-risk pregnancy and has Encounter for supervision of normal pregnancy, unspecified, unspecified trimester; Late prenatal care affecting pregnancy in second trimester; Low vitamin D level; and UTI (urinary tract infection) in pregnancy in second trimester on their problem list.  Patient reports no complaints.  Contractions: Not present. Vag. Bleeding: None.  Movement: Present. Denies leaking of fluid.   The following portions of the patient's history were reviewed and updated as appropriate: allergies, current medications, past family history, past medical history, past social history, past surgical history and problem list. Problem list updated.  Objective:   Vitals:   06/07/17 0812  BP: 109/68  Pulse: 84  Weight: 177 lb 3.2 oz (80.4 kg)    Fetal Status: Fetal Heart Rate (bpm): 142; doppler Fundal Height: 30 cm Movement: Present     General:  Alert, oriented and cooperative. Patient is in no acute distress.  Skin: Skin is warm and dry. No rash noted.   Cardiovascular: Normal heart rate noted  Respiratory: Normal respiratory effort, no problems with respiration noted  Abdomen: Soft, gravid, appropriate for gestational age.  Pain/Pressure: Absent     Pelvic: Cervical exam deferred        Extremities: Normal range of motion.  Edema: None  Mental Status:  Normal mood and affect. Normal behavior. Normal judgment and thought content.   Assessment and Plan:  Pregnancy: G1P0 at 481w5d  1. Supervision of normal first pregnancy, antepartum      Doing well.    2. Low vitamin D level      Taking weekly vitamin D  3. Late prenatal care affecting pregnancy in second trimester     @19  weeks  4. Urinary tract infection in mother during third trimester of pregnancy     TOC today - Culture, OB  Urine  Preterm labor symptoms and general obstetric precautions including but not limited to vaginal bleeding, contractions, leaking of fluid and fetal movement were reviewed in detail with the patient. Please refer to After Visit Summary for other counseling recommendations.  Return in about 2 weeks (around 06/21/2017) for ROB.   Roe Coombsachelle A Arlet Marter, CNM

## 2017-06-07 NOTE — Patient Instructions (Signed)
AREA PEDIATRIC/FAMILY PRACTICE PHYSICIANS  [facility] [address] [place], [place]  [address] Phone - [phone]   Fax - [phone]  [facility] [address] [place], [place] [address] Phone - [phone]   Fax - [phone]  JACK AMOS [address] [place], [place]  [address] Phone - [phone]   Fax - [phone]  [facility] [address] [place], [place]  [address] Phone - [phone]   Fax - [phone]  [facility] [address] [place], [place]  [address] Phone - [phone]   Fax - [phone]  [facility] [address] [place], [place]  [address] Phone - [phone]   Fax - [phone]  [facility] [address] [place], [place]  [address] Phone - [phone]   Fax - [phone]  [facility] [address] [place], [place]  [address] Phone - [phone]   Fax - [phone]  [facility] [address] [place], [place]  [address] Phone - [phone]   Fax - [phone] [facility] [address] [place], [place]  [address] Phone - [phone]   Fax - [phone]  [facility] [address] [place], [place]  [address] Phone - [phone]   Fax - [phone]  [facility] [address] [place], [place]  [address] Phone - [phone]   Fax - [phone]  [facility] [address] [place], [place]  [address] Phone - [phone]   Fax - [phone]  SHILPA GOSRANI [address] [place], [place]  [address] Phone - [phone]  [facility] [address] [place], [place]  [address] Phone - [phone]   Fax - [phone]  [facility] [address] [place], [place]  [address] Phone - [phone]   Fax - [phone]  [facility] [address] [place], [place]  [address] Phone - [phone]   Fax - [phone]  [facility] [address] [place], [place]  [address] Phone - [phone]   Fax - [phone]   [facility] [address] [place], [place]  [address] Phone - [phone]   Fax - [phone]  [facility] [address] [place], [place]  [address] Phone - [phone]   Fax - [phone]  [facility] RUBIN [address] [place], [place]  [address] Phone - [phone]   Fax - [phone]  [facility] [address] [place], [place]  [address] Phone - [phone]   Fax - [phone]  [facility] [address] [place], [place]  [address] Phone - [phone]   Fax - [phone] [facility] [address] [place], [place]  [address] Phone - [phone]   Fax - [phone]  [facility] [address] [place], [place]  [address] Phone - [phone]   Fax - [phone]  [facility] [place] [address] [place],   [address] Phone - [phone]   Fax - [phone]  [facility] [place] Charlene Flemming MD [address] [place]  [address] Phone [phone]  Fax [phone]   

## 2017-06-07 NOTE — Progress Notes (Signed)
Patient reports good fetal movement, denies pain. 

## 2017-06-10 LAB — CULTURE, OB URINE

## 2017-06-10 LAB — URINE CULTURE, OB REFLEX

## 2017-06-21 ENCOUNTER — Encounter: Payer: Medicaid Other | Admitting: Certified Nurse Midwife

## 2017-06-29 ENCOUNTER — Ambulatory Visit (INDEPENDENT_AMBULATORY_CARE_PROVIDER_SITE_OTHER): Payer: Medicaid Other | Admitting: Certified Nurse Midwife

## 2017-06-29 VITALS — BP 121/81 | HR 73 | Wt 186.8 lb

## 2017-06-29 DIAGNOSIS — R7989 Other specified abnormal findings of blood chemistry: Secondary | ICD-10-CM

## 2017-06-29 DIAGNOSIS — O368390 Maternal care for abnormalities of the fetal heart rate or rhythm, unspecified trimester, not applicable or unspecified: Secondary | ICD-10-CM

## 2017-06-29 DIAGNOSIS — Z3403 Encounter for supervision of normal first pregnancy, third trimester: Secondary | ICD-10-CM

## 2017-06-29 DIAGNOSIS — Z3400 Encounter for supervision of normal first pregnancy, unspecified trimester: Secondary | ICD-10-CM

## 2017-06-29 NOTE — Progress Notes (Signed)
Pt denies concerns at this time. 

## 2017-06-29 NOTE — Patient Instructions (Signed)
AREA PEDIATRIC/FAMILY PRACTICE PHYSICIANS  [facility] [address] [place], [place]  [address] Phone - [phone]   Fax - [phone]  [facility] [address] [place], [place] [address] Phone - [phone]   Fax - [phone]  J[facility] AM[facility] [address] [place], [place]  [address] Phone - [phone]   Fax - [phone]  [facility] [address] [place], [place]  [address] Phone - [phone]   Fax - [phone]  [facility] [address] [place]  [address] Phone - [phone]   Fax - [phone]  [facility] [address] [place], [place]  [address] Phone - [phone]   Fax - [phone]  [facility] [address] [place], [place]  [address] Phone - [phone]   Fax - [phone]  [facility] [address] [place], [place]  [address] Phone - [phone]   Fax - [phone]  [facility] [address] [place], [place]  [address] Phone - [phone]   Fax - [phone] [facility] [address] [place], [place]  [address] Phone - [phone]   Fax - [phone]  [facility] [address] [place], [place]  [address] Phone - [phone]   Fax - [phone]  [facility] [address] [place], [place]  [address] Phone - [phone]   Fax - [phone]  [facility] [address] [place], [place]  [address] Phone - [phone]   Fax - [phone]  SHILPA GOSRANI [address] [place], [place]  [address] Phone - [phone]  [facility] [address] [place], [place]  [address] Phone - [phone]   Fax - [phone]  [facility] [address] [place], [place]  [address] Phone - [phone]   Fax - [phone]  [facility] [address] [place], [place]  [address] Phone - [phone]   Fax - [phone]  [facility] [address] [place], [place]  [address] Phone - [phone]   Fax - [phone]   [facility] [address] [place], [place]  [address] Phone - [phone]   Fax - [phone]  [facility] [address] [place], [place]  [address] Phone - [phone]   Fax - [phone]  [facility] [address] [place], [place]  [address] Phone - [phone]   Fax - [phone]  [facility] [address] [place], [place]  [address] Phone - [phone]   Fax - [phone]  [facility] [address] [place], [place]  [address] Phone - [phone]   Fax - [phone] [facility] [address] [place], [place]  [address] Phone - [phone]   Fax - [phone]  [facility] [address] [place], [place]  [address] Phone - [phone]   Fax - [phone]  [facility] [place] [address] [place], [place]  [address] Phone - [phone]   Fax - [phone]  [facility] [place] Charlene Flemming MD [address] [place]  [address] Phone [phone]  Fax [phone]   

## 2017-06-29 NOTE — Progress Notes (Signed)
   PRENATAL VISIT NOTE  Subjective:  Sabrina Werner is a 26 y.o. G1P0 at 1535w6d being seen today for ongoing prenatal care.  She is currently monitored for the following issues for this low-risk pregnancy and has Encounter for supervision of normal pregnancy, unspecified, unspecified trimester; Late prenatal care affecting pregnancy in second trimester; Low vitamin D level; and UTI (urinary tract infection) in pregnancy in second trimester on their problem list.  Patient reports no complaints.  Contractions: Irregular. Vag. Bleeding: None.  Movement: Present. Denies leaking of fluid.   The following portions of the patient's history were reviewed and updated as appropriate: allergies, current medications, past family history, past medical history, past social history, past surgical history and problem list. Problem list updated.  Objective:   Vitals:   06/29/17 0811  BP: 121/81  Pulse: 73  Weight: 186 lb 12.8 oz (84.7 kg)    Fetal Status: Fetal Heart Rate (bpm): 160's doppler NST for tachycardia prolonged monitoring for flat strip that was reactive at the end and in normal range Fundal Height: 35 cm Movement: Present     General:  Alert, oriented and cooperative. Patient is in no acute distress.  Skin: Skin is warm and dry. No rash noted.   Cardiovascular: Normal heart rate noted  Respiratory: Normal respiratory effort, no problems with respiration noted  Abdomen: Soft, gravid, appropriate for gestational age.  Pain/Pressure: Absent     Pelvic: Cervical exam deferred        Extremities: Normal range of motion.  Edema: None  Mental Status:  Normal mood and affect. Normal behavior. Normal judgment and thought content.  NST: + accels with extended time, no decels, moderate variability, Cat. 1 tracing. No contractions on toco.  At start of tracing fetal tachycardia in the 160's after 5 minutes change in base line to 140's. Flat strip for about 20 minutes then accelerations noted.     Assessment and Plan:  Pregnancy: G1P0 at 7135w6d  1. Supervision of normal first pregnancy, antepartum     Reactive NST.  Encouraged to drink more water.   2. Low vitamin D level     Taking weekly vitamin D  3. Antepartum fetal tachycardia affecting care of mother       - Fetal nonstress test; Future  Preterm labor symptoms and general obstetric precautions including but not limited to vaginal bleeding, contractions, leaking of fluid and fetal movement were reviewed in detail with the patient. Please refer to After Visit Summary for other counseling recommendations.  Return in about 2 weeks (around 07/13/2017) for ROB, GBS.   Roe Coombsachelle A Malikah Lakey, CNM

## 2017-07-08 ENCOUNTER — Institutional Professional Consult (permissible substitution): Payer: Self-pay | Admitting: Pediatrics

## 2017-07-14 ENCOUNTER — Other Ambulatory Visit (HOSPITAL_COMMUNITY)
Admission: RE | Admit: 2017-07-14 | Discharge: 2017-07-14 | Disposition: A | Discharge status: Home / Self Care | Payer: Medicaid Other | Source: Ambulatory Visit | Attending: Certified Nurse Midwife | Admitting: Certified Nurse Midwife

## 2017-07-14 ENCOUNTER — Ambulatory Visit (INDEPENDENT_AMBULATORY_CARE_PROVIDER_SITE_OTHER): Payer: Medicaid Other | Admitting: Certified Nurse Midwife

## 2017-07-14 ENCOUNTER — Other Ambulatory Visit: Payer: Self-pay

## 2017-07-14 VITALS — BP 113/79 | HR 76 | Wt 192.0 lb

## 2017-07-14 DIAGNOSIS — Z3403 Encounter for supervision of normal first pregnancy, third trimester: Secondary | ICD-10-CM

## 2017-07-14 DIAGNOSIS — Z3400 Encounter for supervision of normal first pregnancy, unspecified trimester: Secondary | ICD-10-CM | POA: Insufficient documentation

## 2017-07-14 DIAGNOSIS — R7989 Other specified abnormal findings of blood chemistry: Secondary | ICD-10-CM

## 2017-07-14 LAB — OB RESULTS CONSOLE GC/CHLAMYDIA: GC PROBE AMP, GENITAL: NEGATIVE

## 2017-07-14 NOTE — Progress Notes (Signed)
ROB GBS 

## 2017-07-14 NOTE — Patient Instructions (Signed)
AREA PEDIATRIC/FAMILY PRACTICE PHYSICIANS  [facility] [address], [address] [place], [place]  [address] Phone - [phone]   Fax - [phone]  [facility] [address] [place], [place] [address] Phone - [phone]   Fax - [phone]  [facility] AM[facility] [address] [place], [place]  [address] Phone - [phone]   Fax - [phone]  [facility] [address] [place], [place]  [address] Phone - [phone]   Fax - [phone]  [facility] [address] [place], [place]  [address] Phone - [phone]   Fax - [phone]  [facility] [address], [address] [place], [place]  [address] Phone - [phone]   Fax - [phone]  [facility] [address], [address] [place], [place]  [address] Phone - [phone]   Fax - [phone]  [facility] [address], [address] [place], [place]  [address] Phone - [phone]   Fax - [phone]  [facility] [address] [place], [place]  [address] Phone - [phone]   Fax - [phone] [facility] [address] [place], [place]  [address] Phone - [phone]   Fax - [phone]  [facility] [address] [place], [place]  [address] Phone - [phone]   Fax - [phone]  [facility] [address] [place], [place]  [address] Phone - [phone]   Fax - [phone]  [facility] [address], [address] [place], [place]  [address] Phone - [phone]   Fax - [phone]  SHILPA GOSRANI [address] [place], [place]  [address] Phone - [phone]  [facility] [address] [place], [place]  [address] Phone - [phone]   Fax - [phone]  [facility] [address] [place], [place]  [address] Phone - [phone]   Fax - [phone]  [facility] [address] [place], [place]  [address] Phone - [phone]   Fax - [phone]  [facility] [address] [place], [place]  [address] Phone - [phone]   Fax - [phone]   [facility] [address] [place], [place]  [address] Phone - [phone]   Fax - [phone]  [facility] [address] [place], [place]  [address] Phone - [phone]   Fax - [phone]  DAVID RUBIN [address] [place], [place]  [address] Phone - [phone]   Fax - [phone]  [facility] [address] , [place]  [address] Phone - [phone]   Fax - [phone]  [facility] [address] , [place]  [address] Phone - [phone]   Fax - [phone] [facility] [address] [place], [place]  [address] Phone - [phone]   Fax - [phone]  [facility] [address] [place], [place]  [address] Phone - [phone]   Fax - [phone]   [facility] [address] , [place]  [address] Phone - [phone]   Fax - [phone]  [facility] Charlene Flemming MD [address]   [address] Phone [phone]  Fax [phone]   

## 2017-07-14 NOTE — Progress Notes (Signed)
   PRENATAL VISIT NOTE  Subjective:  Sabrina Werner is a 26 y.o. G1P0 at 1416w3d being seen today for ongoing prenatal care.  She is currently monitored for the following issues for this low-risk pregnancy and has Encounter for supervision of normal pregnancy, unspecified, unspecified trimester; Late prenatal care affecting pregnancy in second trimester; Low vitamin D level; and UTI (urinary tract infection) in pregnancy in second trimester on their problem list.  Patient reports no complaints.  Contractions: Irritability. Vag. Bleeding: None.  Movement: Present. Denies leaking of fluid.   The following portions of the patient's history were reviewed and updated as appropriate: allergies, current medications, past family history, past medical history, past social history, past surgical history and problem list. Problem list updated.  Objective:   Vitals:   07/14/17 0807  BP: 113/79  Pulse: 76  Weight: 192 lb (87.1 kg)    Fetal Status: Fetal Heart Rate (bpm): 142; doppler Fundal Height: 36 cm Movement: Present  Presentation: Undeterminable  General:  Alert, oriented and cooperative. Patient is in no acute distress.  Skin: Skin is warm and dry. No rash noted.   Cardiovascular: Normal heart rate noted  Respiratory: Normal respiratory effort, no problems with respiration noted  Abdomen: Soft, gravid, appropriate for gestational age.  Pain/Pressure: Absent     Pelvic: Cervical exam performed Dilation: Closed Effacement (%): Thick Station: Ballotable  Extremities: Normal range of motion.  Edema: None  Mental Status:  Normal mood and affect. Normal behavior. Normal judgment and thought content.   Assessment and Plan:  Pregnancy: G1P0 at 2816w3d  1. Supervision of normal first pregnancy, antepartum     Doing well - Cervicovaginal ancillary only - Strep Gp B NAA  2. Low vitamin D level     Taking weekly vitamin D  Preterm labor symptoms and general obstetric precautions including but not  limited to vaginal bleeding, contractions, leaking of fluid and fetal movement were reviewed in detail with the patient. Please refer to After Visit Summary for other counseling recommendations.  Return in about 2 weeks (around 07/28/2017) for ROB.   Roe Coombsachelle A Shanta Hartner, CNM

## 2017-07-15 LAB — CERVICOVAGINAL ANCILLARY ONLY
Chlamydia: NEGATIVE
NEISSERIA GONORRHEA: NEGATIVE

## 2017-07-16 LAB — STREP GP B NAA: Strep Gp B NAA: POSITIVE — AB

## 2017-07-18 ENCOUNTER — Inpatient Hospital Stay (HOSPITAL_COMMUNITY)
Admission: AD | Admit: 2017-07-18 | Discharge: 2017-07-21 | DRG: 805 | Disposition: A | Discharge status: Home / Self Care | Payer: Medicaid Other | Source: Ambulatory Visit | Attending: Obstetrics & Gynecology | Admitting: Obstetrics & Gynecology

## 2017-07-18 ENCOUNTER — Encounter (HOSPITAL_COMMUNITY): Payer: Self-pay | Admitting: *Deleted

## 2017-07-18 ENCOUNTER — Other Ambulatory Visit: Payer: Self-pay

## 2017-07-18 DIAGNOSIS — O42913 Preterm premature rupture of membranes, unspecified as to length of time between rupture and onset of labor, third trimester: Secondary | ICD-10-CM | POA: Diagnosis present

## 2017-07-18 DIAGNOSIS — Z3400 Encounter for supervision of normal first pregnancy, unspecified trimester: Secondary | ICD-10-CM

## 2017-07-18 DIAGNOSIS — O42919 Preterm premature rupture of membranes, unspecified as to length of time between rupture and onset of labor, unspecified trimester: Secondary | ICD-10-CM

## 2017-07-18 DIAGNOSIS — O99824 Streptococcus B carrier state complicating childbirth: Secondary | ICD-10-CM | POA: Diagnosis present

## 2017-07-18 DIAGNOSIS — Z3A35 35 weeks gestation of pregnancy: Secondary | ICD-10-CM | POA: Diagnosis not present

## 2017-07-18 DIAGNOSIS — O42013 Preterm premature rupture of membranes, onset of labor within 24 hours of rupture, third trimester: Secondary | ICD-10-CM | POA: Diagnosis not present

## 2017-07-18 HISTORY — DX: Other specified health status: Z78.9

## 2017-07-18 LAB — TYPE AND SCREEN
ABO/RH(D): A POS
Antibody Screen: NEGATIVE

## 2017-07-18 LAB — CBC
HEMATOCRIT: 39.8 % (ref 36.0–46.0)
Hemoglobin: 13.6 g/dL (ref 12.0–15.0)
MCH: 31.1 pg (ref 26.0–34.0)
MCHC: 34.2 g/dL (ref 30.0–36.0)
MCV: 90.9 fL (ref 78.0–100.0)
PLATELETS: 246 10*3/uL (ref 150–400)
RBC: 4.38 MIL/uL (ref 3.87–5.11)
RDW: 14.2 % (ref 11.5–15.5)
WBC: 11.4 10*3/uL — ABNORMAL HIGH (ref 4.0–10.5)

## 2017-07-18 LAB — POCT FERN TEST: POCT FERN TEST: POSITIVE

## 2017-07-18 MED ORDER — OXYTOCIN 40 UNITS IN LACTATED RINGERS INFUSION - SIMPLE MED: 2.5000 [IU]/h | INTRAVENOUS | Status: DC

## 2017-07-18 MED ORDER — SOD CITRATE-CITRIC ACID 500-334 MG/5ML PO SOLN: 30.0000 mL | ORAL | Status: DC | PRN

## 2017-07-18 MED ORDER — LIDOCAINE HCL (PF) 1 % IJ SOLN
30.0000 mL | INTRAMUSCULAR | Status: DC | PRN
  Administered 2017-07-19: 30 mL via SUBCUTANEOUS
  Filled 2017-07-18: qty 30

## 2017-07-18 MED ORDER — TERBUTALINE SULFATE 1 MG/ML IJ SOLN
0.2500 mg | Freq: Once | INTRAMUSCULAR | Status: DC | PRN
Start: 2017-07-18 — End: 2017-07-19

## 2017-07-18 MED ORDER — OXYTOCIN BOLUS FROM INFUSION
500.0000 mL | Freq: Once | INTRAVENOUS | Status: AC
  Administered 2017-07-19: 500 mL via INTRAVENOUS

## 2017-07-18 MED ORDER — OXYCODONE-ACETAMINOPHEN 5-325 MG PO TABS: 2.0000 | ORAL_TABLET | ORAL | Status: DC | PRN

## 2017-07-18 MED ORDER — ACETAMINOPHEN 325 MG PO TABS: 650.0000 mg | ORAL_TABLET | ORAL | Status: DC | PRN

## 2017-07-18 MED ORDER — LACTATED RINGERS IV SOLN
INTRAVENOUS | Status: DC
  Administered 2017-07-18 – 2017-07-19 (×3): via INTRAVENOUS

## 2017-07-18 MED ORDER — FENTANYL CITRATE (PF) 100 MCG/2ML IJ SOLN
100.0000 ug | INTRAMUSCULAR | Status: DC | PRN
  Administered 2017-07-19 (×2): 100 ug via INTRAVENOUS
  Filled 2017-07-18 (×2): qty 2

## 2017-07-18 MED ORDER — PENICILLIN G POT IN DEXTROSE 60000 UNIT/ML IV SOLN
3.0000 10*6.[IU] | INTRAVENOUS | Status: DC
  Administered 2017-07-19 (×5): 3 10*6.[IU] via INTRAVENOUS
  Filled 2017-07-18 (×7): qty 50

## 2017-07-18 MED ORDER — PENICILLIN G POTASSIUM 5000000 UNITS IJ SOLR
5.0000 10*6.[IU] | Freq: Once | INTRAVENOUS | Status: AC
  Administered 2017-07-18: 5 10*6.[IU] via INTRAVENOUS
  Filled 2017-07-18: qty 5

## 2017-07-18 MED ORDER — FLEET ENEMA 7-19 GM/118ML RE ENEM: 1.0000 | ENEMA | RECTAL | Status: DC | PRN

## 2017-07-18 MED ORDER — LACTATED RINGERS IV SOLN
500.0000 mL | INTRAVENOUS | Status: DC | PRN
  Administered 2017-07-19: 500 mL via INTRAVENOUS

## 2017-07-18 MED ORDER — BETAMETHASONE SOD PHOS & ACET 6 (3-3) MG/ML IJ SUSP
12.0000 mg | INTRAMUSCULAR | Status: DC
  Administered 2017-07-18: 12 mg via INTRAMUSCULAR
  Filled 2017-07-18 (×2): qty 2

## 2017-07-18 MED ORDER — OXYCODONE-ACETAMINOPHEN 5-325 MG PO TABS: 1.0000 | ORAL_TABLET | ORAL | Status: DC | PRN

## 2017-07-18 MED ORDER — FENTANYL CITRATE (PF) 100 MCG/2ML IJ SOLN: 100.0000 ug | INTRAMUSCULAR | Status: DC | PRN

## 2017-07-18 MED ORDER — ONDANSETRON HCL 4 MG/2ML IJ SOLN
4.0000 mg | Freq: Four times a day (QID) | INTRAMUSCULAR | Status: DC | PRN
Start: 2017-07-18 — End: 2017-07-19

## 2017-07-18 NOTE — MAU Provider Note (Signed)
  History     CSN: [id number]  Arrival date and time: 07/18/17 1937   None     No chief complaint on file.  26 yo g1P0 at 5529w0d here for leaking fluid from vagina since 10 am. Has soaked through 3 panty liners since that time. Denies contractions or vaginal bleeding.     No past medical history on file.  No past surgical history on file.  Family History  Problem Relation Age of Onset  . Diabetes Mother   . Diabetes Father     Social History   Tobacco Use  . Smoking status: Never Smoker  . Smokeless tobacco: Never Used  Substance Use Topics  . Alcohol use: No  . Drug use: No    Allergies: No Known Allergies  Medications Prior to Admission  Medication Sig Dispense Refill Last Dose  . Prenat-FeAsp-Meth-FA-DHA w/o A (PRENATE PIXIE) 10-0.6-0.4-200 MG CAPS Take 1 tablet by mouth daily. 30 capsule 12 Taking  . Vitamin D, Ergocalciferol, (DRISDOL) 50000 units CAPS capsule Take 1 capsule (50,000 Units total) by mouth every 7 (seven) days. 30 capsule 2 Taking    Review of Systems  Constitutional: Negative for activity change and appetite change.  HENT: Negative for congestion and dental problem.   Eyes: Negative for discharge and itching.  Respiratory: Negative for apnea and chest tightness.   Cardiovascular: Negative for chest pain and leg swelling.  Gastrointestinal: Negative for abdominal distention and abdominal pain.  Endocrine: Negative for cold intolerance and heat intolerance.  Genitourinary: Negative for difficulty urinating and vaginal bleeding.  Musculoskeletal: Negative for arthralgias and back pain.  Neurological: Negative for dizziness and light-headedness.  Hematological: Negative for adenopathy. Does not bruise/bleed easily.   Physical Exam   Blood pressure 117/77, pulse 76, temperature 98.4 F (36.9 C), temperature source Oral, resp. rate 16, height 5\' 4"  (1.626 m), weight 192 lb (87.1 kg), last menstrual period 11/04/2016.  Physical Exam   Constitutional: She is oriented to person, place, and time. She appears well-developed and well-nourished.  HENT:  Head: Normocephalic and atraumatic.  Eyes: Conjunctivae are normal. Pupils are equal, round, and reactive to light.  Neck: Normal range of motion.  Cardiovascular: Normal rate and intact distal pulses.  Respiratory: Effort normal. No respiratory distress.  GI: Soft. There is no tenderness. There is no rebound.  Genitourinary: Vagina normal.  Genitourinary Comments: Sterile speculum: grossly rupture with clear fluid. Fern positive Cervix: 1/thick/-2  Musculoskeletal: Normal range of motion. She exhibits no edema.  Neurological: She is alert and oriented to person, place, and time.  Skin: Skin is warm and dry.  Psychiatric: She has a normal mood and affect. Her behavior is normal.    MAU Course  Procedures  MDM PPROM- admit for IOL  Assessment and Plan  1. Pregnancy at 35 weeks completed gestation 2. PPROM- admit for IOL. Notified L&D covering physician.  Chubb Corporationmber Dylann Layne 07/18/2017, 8:13 PM

## 2017-07-18 NOTE — H&P (Signed)
LABOR AND DELIVERY ADMISSION HISTORY AND PHYSICAL NOTE  Sabrina Werner is a 26 y.o. female G1P0 with IUP at 4161w0d by LMP presenting for  PPROM. Has had LOF since 10 am today. She reports positive fetal movement. She denies leakage of fluid or vaginal bleeding.  Prenatal History/Complications: PNC at [place] Pregnancy complications:  - PROM <37wks -GBS pos -[facility] Prenatal Labs  D///ating LMP Blood type: A/Positive/-- (08/21 1654)   Genetic Screen AFP: Neg    NIPS:Mat21:neg Antibody:Negative (08/21 1654)  Anatomic US Female fetus; normal limited US; f/u completed normal Rubella: 1.22 (08/21 1654)  GTT Third trimester: WNL RPR: Non Reactive (10/22 1401)   Flu vaccine  declined 04/20/17 HBsAg: Negative (08/21 1654)   TDaP vaccine 05-24-17                                       Rhogam:n/a A+ HIV:   NR  Baby Food Breast                                              GBS: pos  Contraception ?POP Pap:03/23/17: negative  Circumcision Yes at office   Pediatrician Undecided CF:Neg  Support Person Juliane LackBrian Davis   Prenatal Classes   No Hgb electrophoresis:normal    Past Medical History: Past Medical History:  Diagnosis Date  . Medical history non-contributory     Past Surgical History: History reviewed. No pertinent surgical history.  Obstetrical History: OB History    Gravida Para Term Preterm AB Living   1             SAB TAB Ectopic Multiple Live Births                  Social History: Social History   Socioeconomic History  . Marital status: Single    Spouse name: None  . Number of children: None  . Years of education: None  . Highest education level: None  Social Needs  . Financial resource strain: None  . Food insecurity - worry: None  . Food insecurity - inability: None  . Transportation needs - medical: None  . Transportation needs - non-medical: None  Occupational History  . None  Tobacco Use  . Smoking status: Never Smoker  . Smokeless tobacco: Never  Used  Substance and Sexual Activity  . Alcohol use: No  . Drug use: No  . Sexual activity: Yes    Partners: Male    Birth control/protection: None    Comment: last sex 13 Jul 2017  Other Topics Concern  . None  Social History Narrative  . None    Family History: Family History  Problem Relation Age of Onset  . Diabetes Mother   . Diabetes Father     Allergies: No Known Allergies  Medications Prior to Admission  Medication Sig Dispense Refill Last Dose  . Prenat-FeAsp-Meth-FA-DHA w/o A (PRENATE PIXIE) 10-0.6-0.4-200 MG CAPS Take 1 tablet by mouth daily. 30 capsule 12 07/18/2017 at Unknown time  . Vitamin D, Ergocalciferol, (DRISDOL) 50000 units CAPS capsule Take 1 capsule (50,000 Units total) by mouth every 7 (seven) days. 30 capsule 2 Past Week at Unknown time     Review of Systems  All systems reviewed and negative except as stated in HPI  Physical Exam Blood pressure 127/81, pulse 74, temperature 98.7 F (37.1 C), temperature source Oral, resp. rate 16, height 5\' 4"  (1.626 m), weight 87.1 kg (192 lb), last menstrual period 11/04/2016. General appearance: alert, cooperative, appears stated age and no distress Lungs: clear to auscultation bilaterally Heart: regular rate and rhythm Abdomen: soft, non-tender; bowel sounds normal Extremities: No calf swelling or tenderness Presentation: cephalic per mau exam Fetal monitoring: external Uterine activity: toco Dilation: 1 Effacement (%): Thick Station: -2 Exam by:: Dr. Rachelle HoraMoss   Prenatal labs: ABO, Rh: A/Positive/-- (08/21 1654) Antibody: Negative (08/21 1654) Rubella: 1.22 (08/21 1654) RPR: Non Reactive (10/22 1401)  HBsAg: Negative (08/21 1654)  HIV:   NR GC/Chlamydia: neg GBS: Positive (12/12 0953)  1 hr Glucola: wnl Genetic screening:  Afp/nips: neg Anatomy US: normal, female  Prenatal Transfer Tool  Maternal Diabetes: No Genetic Screening: Normal Maternal Ultrasounds/Referrals: Normal Fetal Ultrasounds or  other Referrals:  None Maternal Substance Abuse:  No Significant Maternal Medications:  None Significant Maternal Lab Results: None, GBS pos  Results for orders placed or performed during the hospital encounter of 07/18/17 (from the past 24 hour(s))  Fern Test   Collection Time: 07/18/17  8:46 PM  Result Value Ref Range   POCT Fern Test Positive = ruptured amniotic membanes     Patient Active Problem List   Diagnosis Date Noted  . UTI (urinary tract infection) in pregnancy in second trimester 03/30/2017  . Low vitamin D level 03/24/2017  . Encounter for supervision of normal pregnancy, unspecified, unspecified trimester 03/23/2017  . Late prenatal care affecting pregnancy in second trimester 03/23/2017    Assessment: Sabrina Werner is a 26 y.o. G1P0 at 6166w0d here for PPROM.  #Labor: expectent management, will hold on IOL for BMZ #Pain: Per maternal request, declines epidural #FWB: Cat 1 #ID:  gbs pos #MOF: breast #MOC:undecided #Circ:  Inpatient circ  Marthenia RollingScott Bland 07/18/2017, 9:32 PM  OB FELLOW HISTORY AND PHYSICAL ATTESTATION  I have seen and examined this patient; I agree with above documentation in the resident's note. Modifications made as appropriate BMZ PNC for GBS positive   Frederik PearJulie P Degele, MD OB Fellow

## 2017-07-18 NOTE — MAU Note (Signed)
Pt says she woke up with a wet bottom and when she wiped she noticed clear, snotty mucous.  She put on a panty liner and had to change it 3 X today because she said it was damp.  Denies any vag bleeding or pain.

## 2017-07-19 ENCOUNTER — Encounter (HOSPITAL_COMMUNITY): Payer: Self-pay | Admitting: *Deleted

## 2017-07-19 DIAGNOSIS — O99824 Streptococcus B carrier state complicating childbirth: Secondary | ICD-10-CM

## 2017-07-19 DIAGNOSIS — O42013 Preterm premature rupture of membranes, onset of labor within 24 hours of rupture, third trimester: Secondary | ICD-10-CM

## 2017-07-19 DIAGNOSIS — O42919 Preterm premature rupture of membranes, unspecified as to length of time between rupture and onset of labor, unspecified trimester: Secondary | ICD-10-CM

## 2017-07-19 DIAGNOSIS — Z3A35 35 weeks gestation of pregnancy: Secondary | ICD-10-CM

## 2017-07-19 LAB — ABO/RH: ABO/RH(D): A POS

## 2017-07-19 LAB — SYPHILIS: RPR W/REFLEX TO RPR TITER AND TREPONEMAL ANTIBODIES, TRADITIONAL SCREENING AND DIAGNOSIS ALGORITHM: RPR Ser Ql: NONREACTIVE

## 2017-07-19 MED ORDER — SIMETHICONE 80 MG PO CHEW: 80.0000 mg | CHEWABLE_TABLET | ORAL | Status: DC | PRN

## 2017-07-19 MED ORDER — OXYTOCIN 40 UNITS IN LACTATED RINGERS INFUSION - SIMPLE MED
1.0000 m[IU]/min | INTRAVENOUS | Status: DC
  Administered 2017-07-19: 2 m[IU]/min via INTRAVENOUS
  Filled 2017-07-19: qty 1000

## 2017-07-19 MED ORDER — ONDANSETRON HCL 4 MG PO TABS: 4.0000 mg | ORAL_TABLET | ORAL | Status: DC | PRN

## 2017-07-19 MED ORDER — ACETAMINOPHEN 325 MG PO TABS
650.0000 mg | ORAL_TABLET | ORAL | Status: DC | PRN
Start: 2017-07-19 — End: 2017-07-21

## 2017-07-19 MED ORDER — DIBUCAINE 1 % RE OINT: 1.0000 "application " | TOPICAL_OINTMENT | RECTAL | Status: DC | PRN

## 2017-07-19 MED ORDER — MISOPROSTOL 50MCG HALF TABLET
50.0000 ug | ORAL_TABLET | ORAL | Status: DC | PRN
  Administered 2017-07-19: 50 ug via ORAL
  Filled 2017-07-19: qty 1

## 2017-07-19 MED ORDER — BENZOCAINE-MENTHOL 20-0.5 % EX AERO: 1.0000 "application " | INHALATION_SPRAY | CUTANEOUS | Status: DC | PRN

## 2017-07-19 MED ORDER — TERBUTALINE SULFATE 1 MG/ML IJ SOLN: 0.2500 mg | Freq: Once | INTRAMUSCULAR | Status: DC | PRN

## 2017-07-19 MED ORDER — WITCH HAZEL-GLYCERIN EX PADS: 1.0000 "application " | MEDICATED_PAD | CUTANEOUS | Status: DC | PRN

## 2017-07-19 MED ORDER — COCONUT OIL OIL: 1.0000 "application " | TOPICAL_OIL | Status: DC | PRN

## 2017-07-19 MED ORDER — TETANUS-DIPHTH-ACELL PERTUSSIS 5-2.5-18.5 LF-MCG/0.5 IM SUSP: 0.5000 mL | Freq: Once | INTRAMUSCULAR | Status: DC

## 2017-07-19 MED ORDER — IBUPROFEN 600 MG PO TABS
600.0000 mg | ORAL_TABLET | Freq: Four times a day (QID) | ORAL | Status: DC
  Administered 2017-07-20 – 2017-07-21 (×5): 600 mg via ORAL
  Filled 2017-07-19 (×6): qty 1

## 2017-07-19 MED ORDER — SENNOSIDES-DOCUSATE SODIUM 8.6-50 MG PO TABS
2.0000 | ORAL_TABLET | ORAL | Status: DC
  Administered 2017-07-20 – 2017-07-21 (×2): 2 via ORAL
  Filled 2017-07-19 (×2): qty 2

## 2017-07-19 MED ORDER — DIPHENHYDRAMINE HCL 25 MG PO CAPS: 25.0000 mg | ORAL_CAPSULE | Freq: Four times a day (QID) | ORAL | Status: DC | PRN

## 2017-07-19 MED ORDER — MISOPROSTOL 200 MCG PO TABS: 1000.0000 ug | ORAL_TABLET | Freq: Once | ORAL | Status: DC

## 2017-07-19 MED ORDER — ONDANSETRON HCL 4 MG/2ML IJ SOLN: 4.0000 mg | INTRAMUSCULAR | Status: DC | PRN

## 2017-07-19 MED ORDER — MISOPROSTOL 200 MCG PO TABS
ORAL_TABLET | ORAL | Status: AC
  Administered 2017-07-19: 600 ug via RECTAL
  Filled 2017-07-19: qty 5

## 2017-07-19 MED ORDER — MISOPROSTOL 200 MCG PO TABS
1000.0000 ug | ORAL_TABLET | Freq: Once | ORAL | Status: AC
  Administered 2017-07-19: 400 ug via ORAL

## 2017-07-19 MED ORDER — PRENATAL MULTIVITAMIN CH
1.0000 | ORAL_TABLET | Freq: Every day | ORAL | Status: DC
  Administered 2017-07-20: 1 via ORAL
  Filled 2017-07-19: qty 1

## 2017-07-19 NOTE — Progress Notes (Signed)
Labor Progress Note Vonzell SchlatterSandra Dauphine is a 26 y.o. G1P0 at 2558w1d presented for PPROM at 35w on 12/16  S: Patient breathing through contractions, rating them 3/10 does not want any medication for pain  O:  BP (!) 101/54   Pulse 79   Temp 98.7 F (37.1 C) (Oral)   Resp 18   Ht 5\' 4"  (1.626 m)   Wt 192 lb (87.1 kg)   LMP 11/04/2016   BMI 32.96 kg/m  EFM: 120 /moderate /+accel no decel   CVE: Dilation: 4 Effacement (%): 50 Cervical Position: Posterior Station: -2 Presentation: Vertex Exam by:: Lorretta Harp. Brown RNC Foley bulb out @ 1405  A&P: 26 y.o. G1P0 4058w1d PPROM  #Labor: Induction progressing well. Pitocin started at 1420 #Pain: No pain medication given, medication ordered to be used PRN  #FWB: Cat 1 #GBS positive   Sharyon CableVeronica C Nechelle Petrizzo, CNM 2:19 PM

## 2017-07-19 NOTE — Progress Notes (Signed)
Patient resting comfortably  FHT Cat I - baseline rate 120, mod variability, +acel, no decel  Julie P. Degele, MD OB Fellow

## 2017-07-19 NOTE — Anesthesia Pain Management Evaluation Note (Signed)
  CRNA Pain Management Visit Note  Patient: Sabrina Werner, 26 y.o., female  "Hello I am a member of the anesthesia team at [facility]. We have an anesthesia team available at all times to provide care throughout the hospital, including epidural management and anesthesia for C-section. I don't know your plan for the delivery whether it a natural birth, water birth, IV sedation, nitrous supplementation, doula or epidural, but we want to meet your pain goals."   1.Was your pain managed to your expectations on prior hospitalizations?   No prior hospitalizations  2.What is your expectation for pain management during this hospitalization?     IV pain meds  3.How can we help you reach that goal? IV pain meds when ready. Patient is aware of all pain control options.  Record the patient's initial score and the patient's pain goal.   Pain: 3  Pain Goal: 8 [facility] wants you to be able to say your pain was always managed very well.  Glendel Jaggers L 07/19/2017

## 2017-07-19 NOTE — Consult Note (Signed)
Neonatology Note:   Attendance at Delivery:    I was asked by V. Aundria Rudogers, CNM for Dr. Macon LargeAnyanwu to attend this NSVD at 35 1/7 weeks after premature ROM yesterday. The mother is a G1P0 A pos, GBS positive [facility], but an otherwise uncomplicated pregnancy. She got 1 dose of Betamethasone last evening and Pitocin was started this afternoon. She also got a dose of Fentanyl IV about 2 hours prior to delivery. ROM 33 hours prior to delivery, fluid bloody. Mother was treated with Pen G > 4 hours before delivery and has been afebrile during labor.  Infant vigorous with good spontaneous cry and tone. Delayed cord clamping was done. Needed only minimal bulb suctioning. Ap 9/9. Lungs clear to ausc in DR, without distress. Approximate weight 2300 grams. I spoke with parents about the baby and will allow him to remain with them for skin to skin time, on MBU status. I informed them of the possibility that he could develop hypoglycemia (recommended supplementation with formula after breast feeding for first 2-3 feedings), hypothermia, or poor feeding. Transferred care to Pediatrician.  Utilizing the [facility] early onset sepsis calculator, this well-appearing infant's risk for sepsis is 0.37/1000; no labs and routine vital signs are recommended. However, if he develops persistent symptoms of possible infection, he may require lab investigation and treatment with antibiotics.   Doretha Souhristie C. Laterica Matarazzo, MD

## 2017-07-19 NOTE — Progress Notes (Signed)
LABOR PROGRESS NOTE  Vonzell SchlatterSandra Abrell is a 26 y.o. G1P0 at 6267w1d  admitted for PPROM @ 35weeks on 12/16 around 10am.  Subjective: Patient is comfortable. States she is having some cramping and pressure occasionally. Does not want any pain medication during labor at this time.   Objective: BP 111/63   Pulse 75   Temp 98.5 F (36.9 C) (Oral)   Resp 18   Ht 5\' 4"  (1.626 m)   Wt 192 lb (87.1 kg)   LMP 11/04/2016   BMI 32.96 kg/m  or  Vitals:   07/19/17 0611 07/19/17 0706 07/19/17 0803 07/19/17 0855  BP: (!) 99/47 (!) 93/57 111/63   Pulse: 77 75 75   Resp:  16 18 18   Temp:   98.5 F (36.9 C)   TempSrc:   Oral   Weight:      Height:        Foley bulb placed @ 0946  Dilation: 1 Effacement (%): 60 Station: -2 Presentation: Vertex Exam by:: Steward DroneVeronica Dana Debo,  CNM  FHT: baseline rate 110, moderate varibility, + acel, no decel Toco: occasional contractions  Labs: Lab Results  Component Value Date   WBC 11.4 (H) 07/18/2017   HGB 13.6 07/18/2017   HCT 39.8 07/18/2017   MCV 90.9 07/18/2017   PLT 246 07/18/2017    Patient Active Problem List   Diagnosis Date Noted  . Preterm premature rupture of membranes (PPROM) with unknown onset of labor 07/19/2017  . UTI (urinary tract infection) in pregnancy in second trimester 03/30/2017  . Low vitamin D level 03/24/2017  . Encounter for supervision of normal pregnancy, unspecified, unspecified trimester 03/23/2017  . Late prenatal care affecting pregnancy in second trimester 03/23/2017    Assessment / Plan: 26 y.o. G1P0 at 1967w1d here for PPROM   Labor: Induction with foley bulb and oral cytotec  Fetal Wellbeing:  Cat 1 Pain Control:  Nothing currently  Anticipated MOD:  SVD  Sharyon CableRogers, Ameila Weldon C, CNM 07/19/2017, 9:53 AM

## 2017-07-19 NOTE — Progress Notes (Signed)
Labor Progress Note Vonzell SchlatterSandra Hunt is a 26 y.o. G1P0 at 7449w1d presented for IOL for PPROM at 35w on 12/16  S: Patient breathing through contractions- wanting IV pain medication  O:  BP 123/68   Pulse 68   Temp 98.6 F (37 C) (Oral)   Resp 20   Ht 5\' 4"  (1.626 m)   Wt 192 lb (87.1 kg)   LMP 11/04/2016   SpO2 97%   BMI 32.96 kg/m   EFM: 110 /moderate /+accel with early deceleration  CVE: Dilation: 5.5 Effacement (%): 100 Cervical Position: Posterior Station: 0 Presentation: Vertex Exam by:: Lanice ShirtsV. Virgie Chery CNM   A&P: 26 y.o. G1P0 1949w1d for IOL for PPROM  #Labor: Progressing well on pitocin, continue pitocin titration  #Pain: IV Fentanyl  #FWB: Cat 1 #GBS positive   Sharyon CableVeronica C Guneet Delpino, CNM 5:47 PM

## 2017-07-20 NOTE — Progress Notes (Signed)
POSTPARTUM PROGRESS NOTE  Post Partum Day 1 Subjective:  Sabrina Werner is a 26 y.o. G1P0101 8034w1d s/p NSVD.  No acute events overnight.  Pt denies problems with ambulating, voiding or po intake.  She denies nausea or vomiting.  Pain is well controlled.  She has had flatus. She has not had bowel movement.  Lochia Minimal.   Objective: Blood pressure (!) 96/43, pulse 72, temperature 97.8 F (36.6 C), temperature source Oral, resp. rate 12, height 5\' 4"  (1.626 m), weight 81.7 kg (180 lb 3.2 oz), last menstrual period 11/04/2016, SpO2 100 %, unknown if currently breastfeeding.  Physical Exam:  General: alert, cooperative and no distress Lochia:normal flow Chest: no respiratory distress Heart:regular rate, distal pulses intact Abdomen: soft, nontender,  Uterine Fundus: firm, appropriately tender DVT Evaluation: No calf swelling or tenderness Extremities: no edema  Recent Labs    07/18/17 2145  HGB 13.6  HCT 39.8    Assessment/Plan:  ASSESSMENT: Sabrina Werner is a 26 y.o. G1P0101 6934w1d s/p NSVD  Plan for discharge tomorrow, will make arrangements to pay for inpatient circ today as she only wants it done in the hospital   LOS: 2 days   Scott BlandDO 07/20/2017, 7:41 AM   OB FELLOW POSTPARTUM PROGRESS NOTE ATTESTATION  I have seen and examined this patient and agree with above documentation in the resident's note.  Doing well Breastfeeding D/c home tomorrow  Frederik PearJulie P Degele, MD OB Fellow 9:01 AM

## 2017-07-21 MED ORDER — IBUPROFEN 600 MG PO TABS: 600.0000 mg | ORAL_TABLET | Freq: Four times a day (QID) | ORAL | 0 refills | Status: AC

## 2017-07-21 NOTE — Lactation Note (Signed)
This note was copied from a baby's chart. Lactation Consultation Note Baby 31 hrs old. 35 weeks weight 5.3 lbs. Mom is supplementing w/22 cal formula. Mom states baby has fed well on breast. Has latched a little but not well.  Mom has wide space between tight breast w/small cone shaped breast. Breast tissue tight and breast heavy. Has small areola and short shaft nipple. Shells encouraged to wear between feedings. Baby is unable to obtain deep latch d/t breast not very compressible. Breast massage to soften breast, hand expressed a few thick drops of colostrum.  Fitted mom w/#20 NS. Positioned baby in football hold w/support. Baby held NS in mouth for a while before finally would suckle here and there. Baby began to suck and stop as expected. LPI information sheet reviewed of LPI feeding habits and behavior. Stressed importance of I&O, STS, supply and demand.  Mom encouraged to feed baby 8-12 times/24 hours and with feeding cues. Mom encouraged to waken baby for feeds.  RN set up DEBP and mom has been pumping, not collecting any colostrum yet. Mom knows for stimulation. Mom knows to pump q3h for 15-20 min.  Mom given hand pump to pre-pump nipple to evert, as well as to soften breast and give some colostrum. Encouraged to call for assistance, alert staff if baby isn't wanting to feed or is to sleepy. WH/LC brochure given w/resources, support groups and LC services.  Patient Name: Sabrina Vonzell SchlatterSandra Werner EAVWU'JToday's Date: 07/21/2017 Reason for consult: Initial assessment;Infant < 6lbs;Late-preterm 34-36.6wks   Maternal Data Has patient been taught Hand Expression?: Yes Does the patient have breastfeeding experience prior to this delivery?: No  Feeding Feeding Type: Breast Fed Length of feed: 5 min(still BF at intervals)  LATCH Score Latch: Repeated attempts needed to sustain latch, nipple held in mouth throughout feeding, stimulation needed to elicit sucking reflex.  Audible Swallowing: None  Type  of Nipple: Everted at rest and after stimulation(short shaft)  Comfort (Breast/Nipple): Filling, red/small blisters or bruises, mild/mod discomfort(breast tight)  Hold (Positioning): Full assist, staff holds infant at breast  LATCH Score: 4  Interventions Interventions: Breast feeding basics reviewed;Breast compression;Assisted with latch;Adjust position;Hand pump;Skin to skin;Support pillows;DEBP;Breast massage;Position options;Hand express;Expressed milk;Shells;Reverse pressure;Pre-pump if needed  Lactation Tools Discussed/Used Tools: Shells;Pump;Nipple Shields Nipple shield size: 20 Shell Type: Inverted Breast pump type: Double-Electric Breast Pump   Consult Status Consult Status: Follow-up Date: 07/21/17 Follow-up type: In-patient    [name], Diamond NickelLAURA G 07/21/2017, 3:07 AM

## 2017-07-21 NOTE — Lactation Note (Signed)
This note was copied from a baby's chart. Lactation Consultation Note  Patient Name: Sabrina Werner AVWUJ'WToday's Date: 07/21/2017 Reason for consult: Follow-up assessment   Baby 38 hours old, 4727w1d, < 6 lbs. and mother states he recently bf with #20NS for 10 min and was given 25 ml of formula. Encouraged mother to post pump 4-6 times per day for 10-20 min after breastfeeding to help establish her milk supply. Mother thinks she may have a DEBP in her registry but is unsure.  Discussed taking pump parts home. Suggest mother call if she would like assistance today with breastfeeding.  Mom encouraged to feed baby 8-12 times/24 hours and with feeding cues at least q 3 hours.     Maternal Data    Feeding Feeding Type: Breast Fed Length of feed: 15 min  LATCH Score Latch: Repeated attempts needed to sustain latch, nipple held in mouth throughout feeding, stimulation needed to elicit sucking reflex.  Audible Swallowing: A few with stimulation  Type of Nipple: Everted at rest and after stimulation  Comfort (Breast/Nipple): Filling, red/small blisters or bruises, mild/mod discomfort  Hold (Positioning): Assistance needed to correctly position infant at breast and maintain latch.  LATCH Score: 6  Interventions    Lactation Tools Discussed/Used Tools: Nipple Dorris CarnesShields   Consult Status Consult Status: Follow-up Date: 07/22/17 Follow-up type: In-patient    Dahlia ByesBerkelhammer, Laila Myhre [facility]Boschen 07/21/2017, 9:28 AM

## 2017-07-21 NOTE — Discharge Summary (Signed)
OB Discharge Summary     Patient Name: Sabrina SchlatterSandra Werner DOB: 05/03/1991 MRN: [id number]  Date of admission: 07/18/2017 Delivering MD: Sharyon CableOGERS, VERONICA C   Date of discharge: 07/21/2017  Admitting diagnosis: 35 WKS, LEAKING Intrauterine pregnancy: 8424w1d     Secondary diagnosis:  Principal Problem:   Preterm premature rupture of membranes (PPROM) with unknown onset of labor UTI during pregnancy with neg TOC  Additional problems: low vitD     Discharge diagnosis: Preterm Pregnancy Delivered                                                                                                Post partum procedures:n/a  Augmentation: Pitocin  Complications: ROM>24 hours  Hospital course:  Induction of Labor With Vaginal Delivery   26 y.o. yo G1P0101 at 5324w1d was admitted to the hospital 07/18/2017 for induction of labor.  Indication for induction: PROM.  Patient had an uncomplicated labor course as follows: Membrane Rupture Time/Date: 10:00 AM ,07/18/2017   Intrapartum Procedures: Episiotomy: None [1]                                         Lacerations:  1st degree [2];Perineal [11]  Patient had delivery of a Viable infant.  Information for the patient's newborn:  Sabrina Werner, Sabrina Werner [[id number]]  Delivery Method: Vaginal, Spontaneous(Filed from Delivery Summary)   07/19/2017  Details of delivery can be found in separate delivery note.  Patient had a routine postpartum course. Patient is discharged home 07/21/17.  Physical exam  Vitals:   07/20/17 0500 07/20/17 1806 07/20/17 2150 07/21/17 0612  BP: (!) 96/43 105/68 106/66 105/65  Pulse: 72 66 70 63  Resp: 12 13 14 16   Temp: 97.8 F (36.6 C) 98.2 F (36.8 C) 98 F (36.7 C) 98.2 F (36.8 C)  TempSrc: Oral Oral Oral Oral  SpO2:      Weight: 81.7 kg (180 lb 3.2 oz)     Height:       General: alert, cooperative and no distress Lochia: appropriate Uterine Fundus: firm Incision: n/a DVT Evaluation: No evidence of DVT seen on  physical exam. Labs: Lab Results  Component Value Date   WBC 11.4 (H) 07/18/2017   HGB 13.6 07/18/2017   HCT 39.8 07/18/2017   MCV 90.9 07/18/2017   PLT 246 07/18/2017   CMP Latest Ref Rng & Units 01/08/2017  Glucose 65 - 99 mg/dL 83  BUN 6 - 20 mg/dL 6  Creatinine 7.820.44 - 9.561.00 mg/dL 2.130.55  Sodium 086135 - 578145 mmol/L 134(L)  Potassium 3.5 - 5.1 mmol/L 4.0  Chloride 101 - 111 mmol/L 106  CO2 22 - 32 mmol/L 21(L)  Calcium 8.9 - 10.3 mg/dL 4.6(N8.7(L)    Discharge instruction: per After Visit Summary and "Baby and Me Booklet".  After visit meds:  Allergies as of 07/21/2017   No Known Allergies     Medication List    TAKE these medications   ibuprofen 600 MG tablet Commonly known as:  ADVIL,MOTRIN Take  1 tablet (600 mg total) by mouth every 6 (six) hours.   PRENATE PIXIE 10-0.6-0.4-200 MG Caps Take 1 tablet by mouth daily.   Vitamin D (Ergocalciferol) 50000 units Caps capsule Commonly known as:  DRISDOL Take 1 capsule (50,000 Units total) by mouth every 7 (seven) days.       Diet: routine diet  Activity: Advance as tolerated. Pelvic rest for 6 weeks.   Outpatient follow up:4wks Follow up Appt: Future Appointments  Date Time Provider Department Center  08/17/2017  1:00 PM Allie Bossierove, Myra C, MD [facility] None   Follow up Visit:No Follow-up on file.  Postpartum contraception: Undecided  Newborn Data: Live born female  Birth Weight: 5 lb 4 oz (2380 g) APGAR: 9, 9  Newborn Delivery   Birth date/time:  07/19/2017 19:12:00 Delivery type:  Vaginal, Spontaneous     Baby Feeding: Breast Disposition:home with mother   07/21/2017 Marthenia RollingScott Bland, DO   I spoke with and examined patient and agree with resident/PA/SNM's note and plan of care. Eating, drinking, voiding, ambulating well.  +flatus.  Lochia and pain wnl.  Denies dizziness, lightheadedness, or sob. No complaints. Plans POPs for contraception. Inpatient circ prior to d/c.  Cheral MarkerKimberly R. Booker, CNM, [facility]WHNP-BC 07/22/2017 9:36  AM

## 2017-07-22 ENCOUNTER — Other Ambulatory Visit: Payer: Self-pay | Admitting: Certified Nurse Midwife

## 2017-07-22 ENCOUNTER — Ambulatory Visit: Payer: Self-pay

## 2017-07-22 NOTE — Lactation Note (Addendum)
This note was copied from a baby's chart. Lactation Consultation Note  Patient Name: Boy Vonzell SchlatterSandra Imai ZOXWR'UToday's Date: 07/22/2017 Reason for consult: Follow-up assessment   Baby 61 hours old.  [redacted]w[redacted]D < 6 lbs. Mother's breasts are filling.  Reviewed hand expression with good flow. Assisted mother with latching in cross cradle hold.  Mother still working on achieving depth. Mother has been pumping good volumes of breastmilk 18-25 ml. Plan is for mother to breastfeed for up to 30 min and give baby supplemental breastmilk or formula after feedings. Reviewed volume guidelines. Mom encouraged to feed baby 8-12 times/24 hours and with feeding cues at least q 3 hours.  Reviewed engorgement care and monitoring voids/stools. Provided mother w/ [facility].     Maternal Data Has patient been taught Hand Expression?: Yes  Feeding Feeding Type: Breast Fed Length of feed: 20 min  LATCH Score                   Interventions Interventions: Breast feeding basics reviewed;Assisted with latch;Hand express  Lactation Tools Discussed/Used Breast pump type: Double-Electric Breast Pump   Consult Status Consult Status: Complete    Hardie PulleyBerkelhammer, Ruth Boschen 07/22/2017, 9:07 AM

## 2017-08-04 ENCOUNTER — Telehealth (HOSPITAL_COMMUNITY): Payer: Self-pay | Admitting: Lactation Services

## 2017-08-04 NOTE — Telephone Encounter (Signed)
[facility]. Mom reports she had a 5 pound infant 2 weeks ago and took home a [employer] [facility]. She is still needing to use the pump at this time. Mom has WIC appt on Jan 24 th. [facility] and see if she can get in sooner to get a pump and can return the loaner later this week or early next week. Mom to call back with further questions/concerns as needed.

## 2017-08-17 ENCOUNTER — Encounter: Payer: Self-pay | Admitting: Obstetrics & Gynecology

## 2017-08-17 ENCOUNTER — Ambulatory Visit (INDEPENDENT_AMBULATORY_CARE_PROVIDER_SITE_OTHER): Payer: Medicaid Other | Admitting: Obstetrics & Gynecology

## 2017-08-17 DIAGNOSIS — Z1389 Encounter for screening for other disorder: Secondary | ICD-10-CM

## 2017-08-17 MED ORDER — NORETHINDRONE 0.35 MG PO TABS: 1.0000 | ORAL_TABLET | Freq: Every day | ORAL | 11 refills | Status: AC

## 2017-08-17 NOTE — Progress Notes (Signed)
Post Partum Exam  Sabrina SchlatterSandra Werner is a 27 y.o. 421P0101 female who presents for a postpartum visit. She is 4 weeks postpartum following a spontaneous vaginal delivery. I have fully reviewed the prenatal and intrapartum course. The delivery was at 35 gestational weeks.  Anesthesia: IV Fentanyl. Postpartum course has been unremarkable. Baby's course has been unremarkable. Baby is feeding by breast. Bleeding no bleeding. Bowel function is normal. Bladder function is normal. Patient is not sexually active. Contraception method is none. Postpartum depression screening:neg  The following portions of the patient's history were reviewed and updated as appropriate: allergies, current medications, past family history, past medical history, past social history, past surgical history and problem list.  Review of Systems Pertinent items are noted in HPI.    Objective:  unknown if currently breastfeeding.  General:  alert   Breasts:  inspection negative, no nipple discharge or bleeding, no masses or nodularity palpable  Lungs: clear to auscultation bilaterally  Heart:  regular rate and rhythm, S1, S2 normal, no murmur, click, rub or gallop  Abdomen: soft, non-tender; bowel sounds normal; no masses,  no organomegaly   Vulva:  not evaluated  Vagina: not evaluated  Cervix:  not evaluated  Corpus: not examined  Adnexa:  not evaluated  Rectal Exam: Not performed.        Assessment:    Normal postpartum exam. Pap smear not done at today's visit.   Plan:   1. Contraception: oral progesterone-only contraceptive 2. She will call or come in when she starts having periods/is decreasing her breastfeeding, for regular OCPs She is aware of the 80% effectiveness rate of POPs

## 2017-08-20 ENCOUNTER — Encounter: Payer: Self-pay | Admitting: Obstetrics & Gynecology

## 2017-09-30 IMAGING — US US OB COMP LESS 14 WK
1 series · 14 of 28 positions shown · non-contrast
Comparison: None.

CLINICAL DATA: 26-year-old pregnant female with reported history of
abnormal ultrasound of the outside location. Estimated gestational
age of 9 weeks 2 days by LMP. Beta HCG of 197,000

EXAM:
OBSTETRIC <14 WK US AND TRANSVAGINAL OB US
TECHNIQUE: Both transabdominal and transvaginal ultrasound examinations were
performed for complete evaluation of the gestation as well as the
maternal uterus, adnexal regions, and pelvic cul-de-sac.
Transvaginal technique was performed to assess early pregnancy.

[Series 1: us ob comp less 14 wk · 0.15mm/px · 72 acquisitions, 14 frames shown]
[im 3/72]
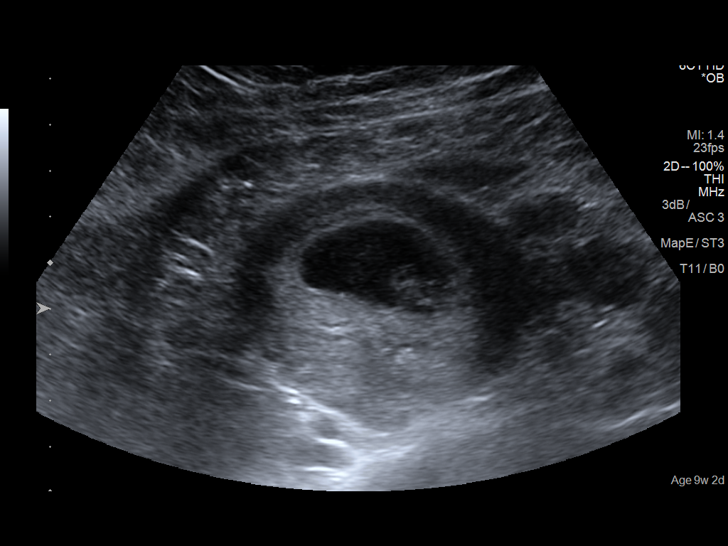
[im 8/72]
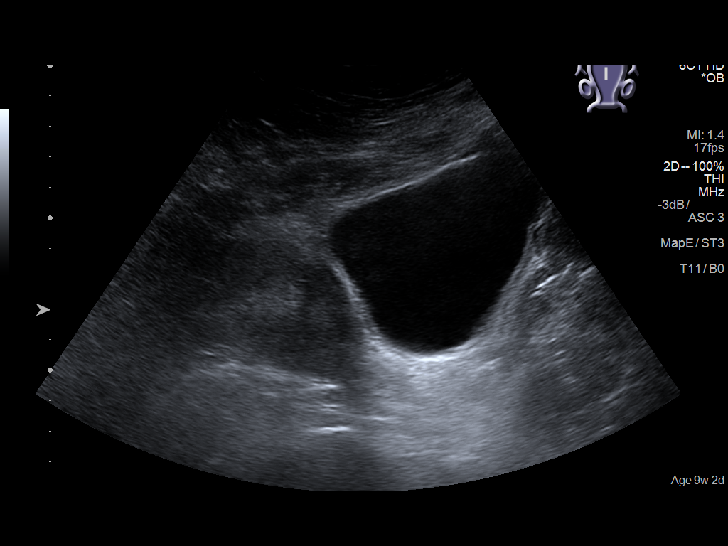
[im 14/72]
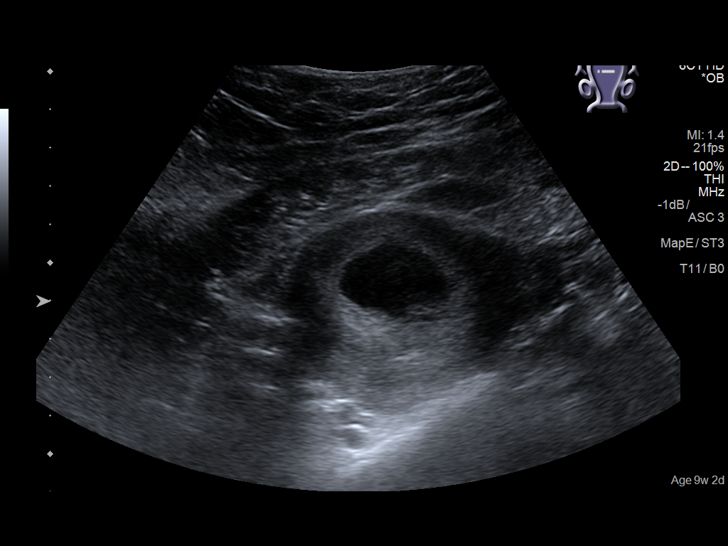
[im 19/72]
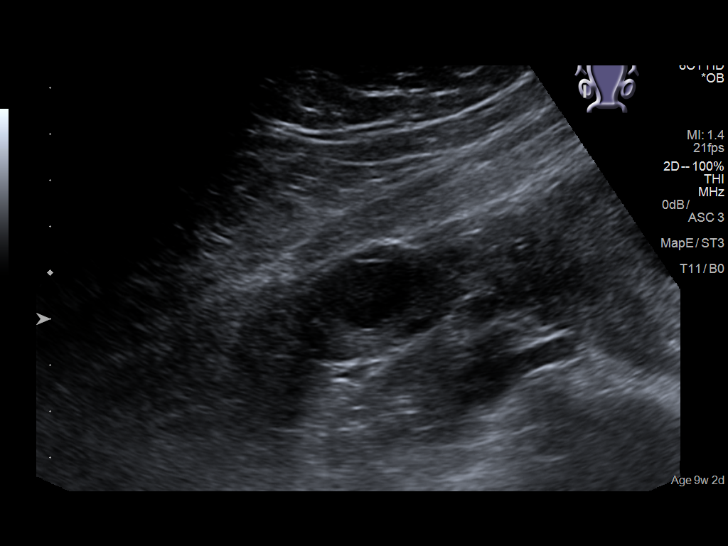
[im 24/72]
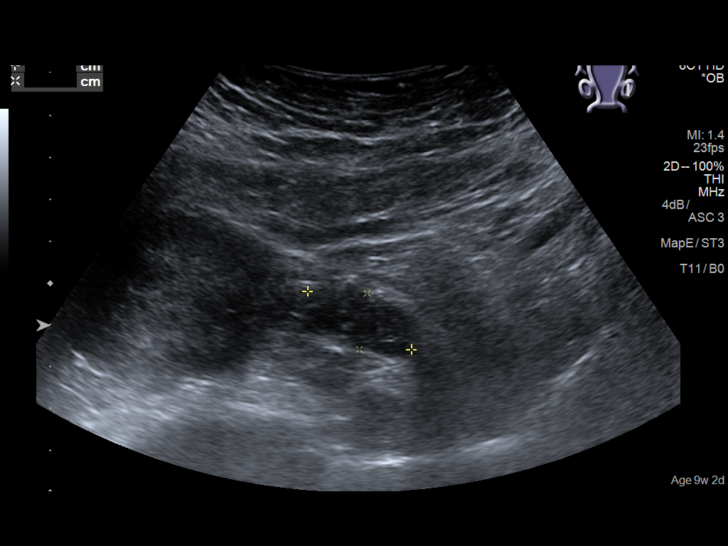
[im 29/72]
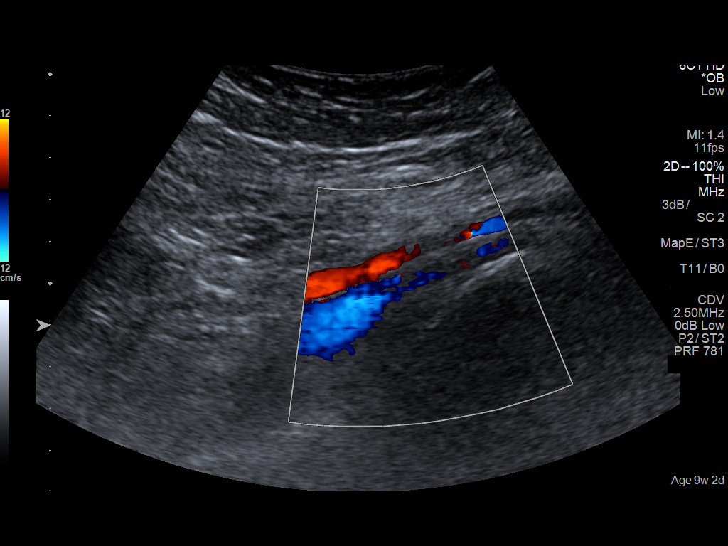
[im 35/72]
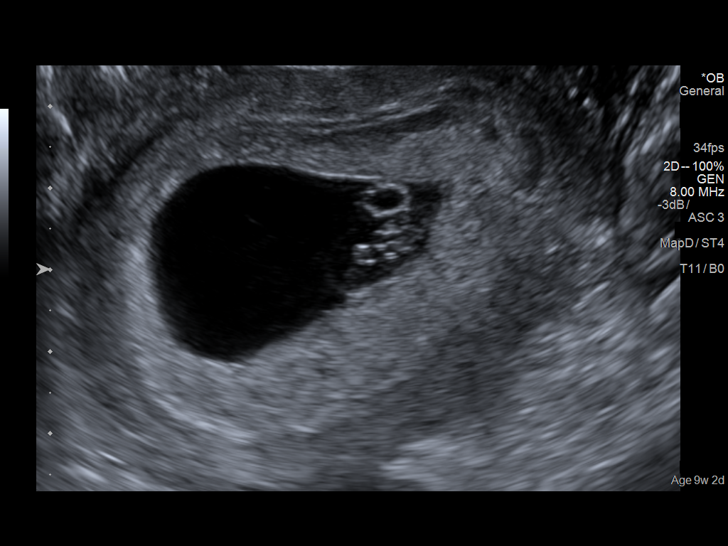
[im 40/72]
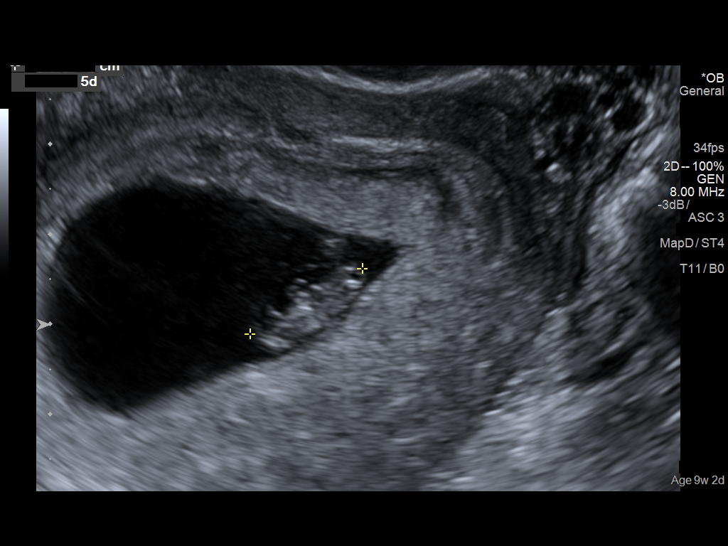
[im 45/72]
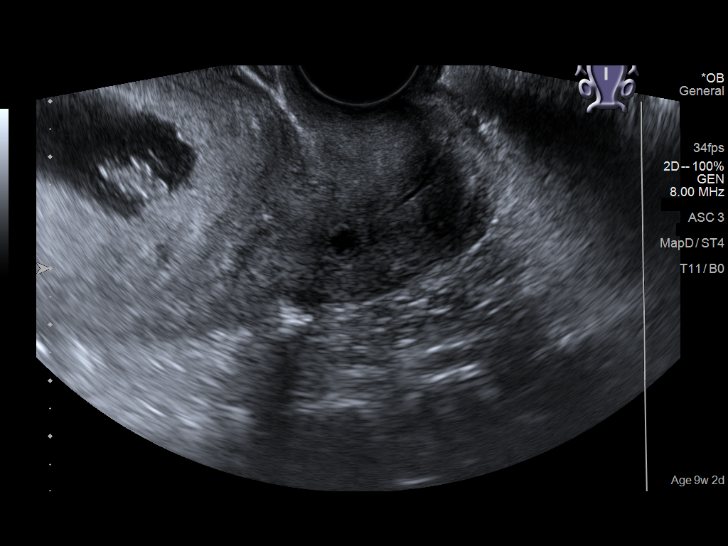
[im 50/72]
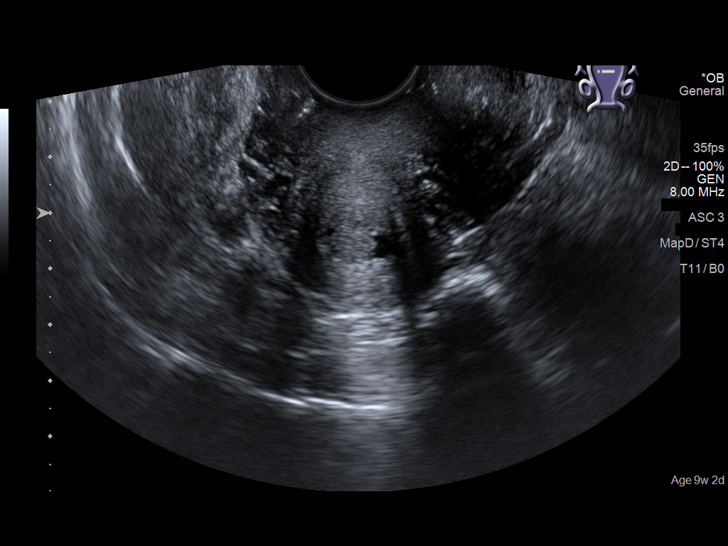
[im 56/72]
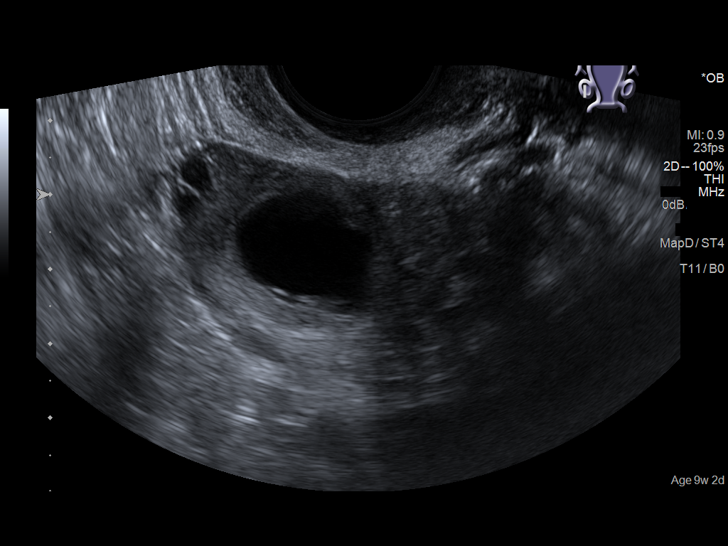
[im 61/72]
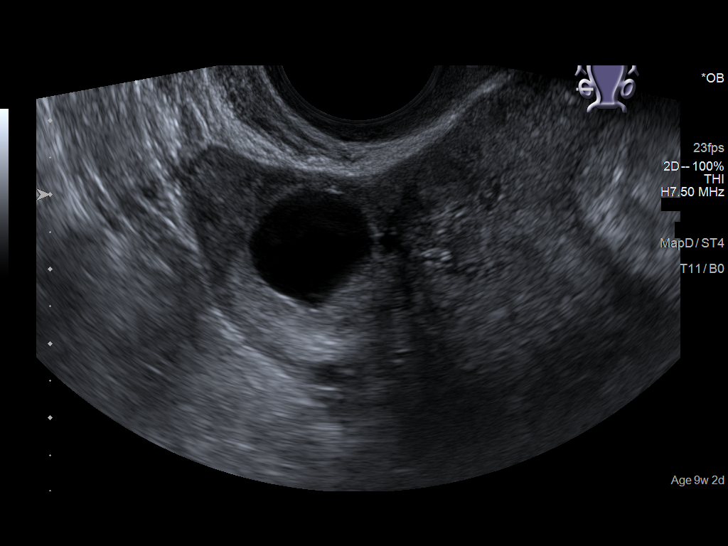
[im 66/72]
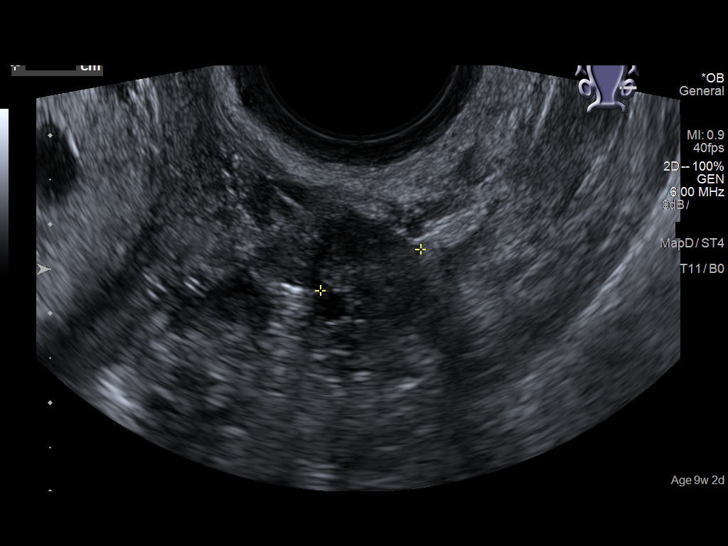
[im 72/72]
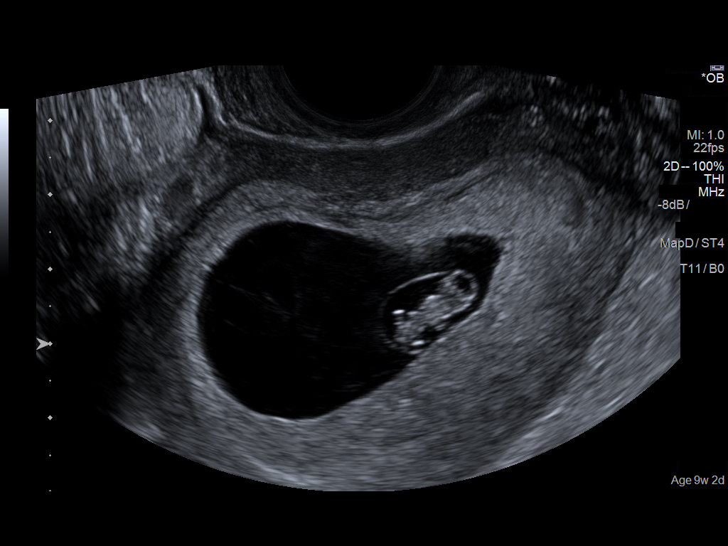

[14 of 28 positions shown; findings below may reference images not displayed]

FINDINGS: Intrauterine gestational sac: Single

Yolk sac:  Visualized.

Embryo:  Visualized.

Cardiac Activity: Visualized.

Heart Rate: 149  bpm

CRL:  14.5  mm   7 w   5 d                  US EDC: 08/22/2017

Subchorionic hemorrhage:  None visualized.

Maternal uterus/adnexae: The ovaries and adnexal regions are
unremarkable.
IMPRESSION: Single living intrauterine gestation with estimated gestational age
of 7 weeks 5 days by this ultrasound. No evidence of subchorionic
hemorrhage.
